# Patient Record
Sex: Female | Born: 2000 | Race: White | Hispanic: No | Marital: Single | Smoking: Never smoker
Health system: Southern US, Community
[De-identification: ages and names within clinical notes are randomized; demographics above are authoritative.]

## PROBLEM LIST (undated history)

## (undated) DIAGNOSIS — Z789 Other specified health status: Secondary | ICD-10-CM

## (undated) HISTORY — DX: Other specified health status: Z78.9

## (undated) HISTORY — PX: OTHER SURGICAL HISTORY: SHX169

## (undated) HISTORY — PX: NO PAST SURGERIES: SHX2092

---
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
## 2021-01-05 NOTE — Progress Notes (Signed)
Consulted by RN re: patient situation.  Reviewed RN note and agree that it reflects our discussion and my recommendations.  ° ° °Hollie Wojahn, FNP  °

## 2021-01-27 ENCOUNTER — Other Ambulatory Visit: Payer: Self-pay

## 2021-02-02 ENCOUNTER — Telehealth: Payer: Self-pay | Admitting: Certified Nurse Midwife

## 2021-02-02 NOTE — Telephone Encounter (Signed)
Patient was confirmed pregnant throught the [facility].  She stated that she started to bleed on Thursday which started off brown and now is redish pink in color.  Patient stated that she did have some cramping later last week in her lower back and abdominal area

## 2021-02-02 NOTE — Telephone Encounter (Signed)
Call patient, patient did not answer phone.

## 2021-02-03 ENCOUNTER — Encounter: Payer: Self-pay | Admitting: Certified Nurse Midwife

## 2021-02-03 ENCOUNTER — Other Ambulatory Visit (INDEPENDENT_AMBULATORY_CARE_PROVIDER_SITE_OTHER): Payer: Self-pay

## 2021-02-03 ENCOUNTER — Ambulatory Visit (INDEPENDENT_AMBULATORY_CARE_PROVIDER_SITE_OTHER): Payer: Self-pay | Admitting: Certified Nurse Midwife

## 2021-02-03 ENCOUNTER — Other Ambulatory Visit: Payer: Self-pay

## 2021-02-03 ENCOUNTER — Other Ambulatory Visit: Payer: Self-pay | Admitting: Certified Nurse Midwife

## 2021-02-03 VITALS — BP 127/82 | HR 116 | Wt 104.1 lb

## 2021-02-03 DIAGNOSIS — Z113 Encounter for screening for infections with a predominantly sexual mode of transmission: Secondary | ICD-10-CM

## 2021-02-03 DIAGNOSIS — Z3A13 13 weeks gestation of pregnancy: Secondary | ICD-10-CM

## 2021-02-03 DIAGNOSIS — O3680X0 Pregnancy with inconclusive fetal viability, not applicable or unspecified: Secondary | ICD-10-CM

## 2021-02-03 DIAGNOSIS — Z3402 Encounter for supervision of normal first pregnancy, second trimester: Secondary | ICD-10-CM

## 2021-02-03 DIAGNOSIS — Z0283 Encounter for blood-alcohol and blood-drug test: Secondary | ICD-10-CM

## 2021-02-03 NOTE — Progress Notes (Addendum)
NEW OB HISTORY AND PHYSICAL  SUBJECTIVE:       Pamela Glenn is a 20 y.o. G16P0000 female, Patient's last menstrual period was 10/30/2020 (exact date)., Estimated Date of Delivery: 08/06/21, [redacted]w[redacted]d, presents today for establishment of Prenatal Care. She has  had bleeding and cramping, with a small amount of clots noted.     Relationship : with her boyfriend  Living : with 2 roommates [employer]  Exercise none Alcohol/Drugs/Smoking:  denies use    Gynecologic History Patient's last menstrual period was 10/30/2020 (exact date). Normal Contraception: none Last Pap: n/a .   Obstetric History OB History  Gravida Para Term Preterm AB Living  1 0 0 0 0    SAB IAB Ectopic Multiple Live Births  0 0 0 0 0    # Outcome Date GA Lbr Len/2nd Weight Sex Delivery Anes PTL Lv  1 Current             Past Medical History:  Diagnosis Date   Patient denies medical problems     Past Surgical History:  Procedure Laterality Date   denies       Current Outpatient Medications on File Prior to Visit  Medication Sig Dispense Refill   Prenatal Vit-Fe Fumarate-FA (MULTIVITAMIN-PRENATAL) 27-0.8 MG TABS tablet Take 1 tablet by mouth daily at 12 noon. 100 tablet 0   No current facility-administered medications on file prior to visit.    No Known Allergies  Social History   Socioeconomic History   Marital status: Single    Spouse name: Not on file   Number of children: Not on file   Years of education: Not on file   Highest education level: Not on file  Occupational History   Not on file  Tobacco Use   Smoking status: Never   Smokeless tobacco: Never  Vaping Use   Vaping Use: Former   Substances: Nicotine  Substance and Sexual Activity   Alcohol use: Not Currently    Comment: last use 12/2020- bottle of vodka   Drug use: Not Currently    Types: Marijuana    Comment: stopped when found out preg (12/2020)   Sexual activity: Yes    Birth control/protection: None, Pill     Comment: hx of ocp, stopped 2021, prob with depression  Other Topics Concern   Not on file  Social History Narrative   Not on file   Social Determinants of Health   Financial Resource Strain: Not on file  Food Insecurity: Not on file  Transportation Needs: Not on file  Physical Activity: Not on file  Stress: Not on file  Social Connections: Not on file  Intimate Partner Violence: Not At Risk   Fear of Current or Ex-Partner: No   Emotionally Abused: No   Physically Abused: No   Sexually Abused: No    No family history on file.  The following portions of the patient's history were reviewed and updated as appropriate: allergies, current medications, past OB history, past medical history, past surgical history, past family history, past social history, and problem list.    OBJECTIVE: Initial Physical Exam (New OB)  GENERAL APPEARANCE: alert, well appearing, in no apparent distress, oriented to person, place and time HEAD: normocephalic, atraumatic MOUTH: mucous membranes moist, pharynx normal without lesions THYROID: no thyromegaly or masses present BREASTS: no masses noted, no significant tenderness, no palpable axillary nodes, no skin changes LUNGS: clear to auscultation, no wheezes, rales or rhonchi, symmetric air entry HEART: regular rate and rhythm,  no murmurs ABDOMEN: soft, nontender, nondistended, no abnormal masses, no epigastric pain and FHT not heard EXTREMITIES: no redness or tenderness in the calves or thighs, no edema, no limitation in range of motion, intact peripheral pulses SKIN: normal coloration and turgor, no rashes LYMPH NODES: no adenopathy palpable NEUROLOGIC: alert, oriented, normal speech, no focal findings or movement disorder noted  PELVIC EXAM EXTERNAL GENITALIA: normal appearing vulva with no masses, tenderness or lesions VAGINA: no abnormal discharge or lesions and blood Small amount pink red noted  CERVIX: no lesions or cervical motion  tenderness UTERUS: fundus not palpated ADNEXA: no masses palpable and nontender OB EXAM PELVIMETRY: appears adequate RECTUM: exam not indicated  Patient Name: Pamela Glenn DOB: 2000-07-21 MRN: [id number]  ULTRASOUND REPORT  Location: [facility] Date of Service: 02/03/2021   Indications:Threatened AB; bleeding Findings:  There is a Gestational Sac in the uterus measuring 36.2 mm (mean), consistent with 8wk6d gestation, however,  There is:  NO cardiac activity seen. NO Fetal Pole seen.  NO Yolk Sac seen. NO Amnion seen.  During the coarse of the scan, a blood clot was seen passing from the GS into the internal Os and into vaginal canal.  Ovaries are not visualized. Survey of the adnexa demonstrates no adnexal masses. There is no free peritoneal fluid in the cul de sac.  Ultrasound  Impression: 1. NON-Viable Intrauterine pregnancy by U/S.  Recommendations: 1.Clinical correlation with the patient's History and Physical Exam.  Sheralyn Boatman  H[place]-Gainey  ASSESSMENT: Miscarriage.   PLAN: Discussed miscarriage, beta quant ordered. Will follow up with results. Reassurance given. Discussed watchful waiting for passing of tissue , use of medications to help passing of tissue. Discussed referral for grief counseling if pt desires. She will let me know. Will follow up with results.    Doreene Burke, CNM

## 2021-02-03 NOTE — Patient Instructions (Addendum)
Miscarriage A miscarriage is the loss of a pregnancy before the 20th week of pregnancy.Sometimes, a pregnancy ends before a woman knows that she is pregnant. If you lose a pregnancy, talk with your doctor about: Questions you have about the loss of your baby. How to work through your grief. Plans for future pregnancy. What are the causes? Many times, the cause of this condition is not known. What increases the risk? These things may make a pregnant woman more likely to lose a pregnancy: Certain health conditions Conditions that affect hormones, such as: Thyroid disease. Polycystic ovary syndrome. Diabetes. A disease that causes the body's disease-fighting system to attack itself by mistake. Infections. Bleeding problems. Being very overweight. Lifestyle factors Using products that have tobacco or nicotine in them. Being around tobacco smoke. Having alcohol. Having a lot of caffeine. Using drugs. Problems with reproductive organs or parts Having a cervix that opens and thins before your due date. The cervix is the lowest part of your womb. Having Asherman syndrome, which leads to: Scars in the womb. The womb being abnormal in shape. Growths (fibroids) in the womb. Problems in the body that are present at birth. Infection of the cervix or womb. Personal or health history Injury. Having lost a pregnancy before. Being younger than age 18 or older than age 35. Being around a harmful substance, such as radiation. Having lead or other heavy metals in: Things you eat or drink. The air around you. Using certain medicines. What are the signs or symptoms? Blood or spots of blood coming from the vagina. You may also have cramps or pain. Pain or cramps in the belly or low back. Fluid or tissue coming out of the vagina. How is this treated? Sometimes, treatment is not needed. If you need treatment, you may be treated with: A procedure to open the cervix more and take tissue out of  the womb. Medicines. You may get a shot of medicine called Rho(D) immune globulin. Follow these instructions at home: Medicines Take over-the-counter and prescription medicines only as told by your doctor. If you were prescribed antibiotic medicine, take it as told by your doctor. Do not stop taking it even if you start to feel better. Activity Rest as told by your doctor. Ask your doctor what activities are safe for you. Have someone help you at home during this time. General instructions  Watch how much tissue comes out of the vagina. Watch the size of any blood clots that come out of the vagina. Do not have sex or douche until your doctor says it is okay. Do not put things, such as tampons, in your vagina until your doctor says it is okay. To help you and your partner with grieving: Talk with your doctor. See a counselor. When you are ready, talk with your doctor about: Things to do for your health. How you can be healthy if you get pregnant again. Keep all follow-up visits.  Where to find more information The American College of Obstetricians and Gynecologists: acog.org [place] Department of Health and Human Services Office of Women's Health: hrsa.gov/office-womens-health Contact a doctor if: You have a fever or chills. There is bad-smelling fluid coming from the vagina. You have more bleeding. Tissue or clots of blood come out of your vagina. Get help right away if: You have very bad cramps or pain in your back or belly. You soak more than 2 large pads in an hour for more than 2 hours. You get light-headed or weak. You faint.   You feel sad, and you have sad thoughts a lot of the time. You think about hurting yourself. Get help right awayif you feel like you may hurt yourself or others, or have thoughts about taking your own life. Go to your nearest emergency room or: Call your local emergency services (911 in the [place] Call the National Suicide Prevention Lifeline at  1-800-273-8255. This is open 24 hours a day. Text the Crisis Text Line at 741741. Summary A miscarriage is the loss of a pregnancy before the 20th week of pregnancy. Sometimes, a pregnancy ends before a woman knows that she is pregnant. Follow instructions from your doctor about medicines and activity. To help you and your partner with grieving, talk with your doctor or a counselor. Keep all follow-up visits. This information is not intended to replace advice given to you by your health care provider. Make sure you discuss any questions you have with your healthcare provider. Document Revised: 10/19/2019 Document Reviewed: 10/19/2019 Elsevier Patient Education  2022 Elsevier Inc.  

## 2021-02-04 ENCOUNTER — Encounter: Payer: Self-pay | Admitting: Certified Nurse Midwife

## 2021-02-04 LAB — BETA HCG QUANT (REF LAB): hCG Quant: 3727 m[IU]/mL

## 2021-02-09 ENCOUNTER — Other Ambulatory Visit: Payer: Self-pay | Admitting: Certified Nurse Midwife

## 2021-02-09 DIAGNOSIS — O039 Complete or unspecified spontaneous abortion without complication: Secondary | ICD-10-CM

## 2021-02-11 ENCOUNTER — Other Ambulatory Visit: Payer: Self-pay

## 2021-02-11 DIAGNOSIS — O039 Complete or unspecified spontaneous abortion without complication: Secondary | ICD-10-CM

## 2021-02-12 ENCOUNTER — Other Ambulatory Visit: Payer: Self-pay | Admitting: Certified Nurse Midwife

## 2021-02-12 DIAGNOSIS — O039 Complete or unspecified spontaneous abortion without complication: Secondary | ICD-10-CM

## 2021-02-12 LAB — BETA HCG QUANT (REF LAB): hCG Quant: 586 m[IU]/mL

## 2021-02-26 ENCOUNTER — Other Ambulatory Visit: Payer: Self-pay

## 2021-02-26 DIAGNOSIS — O039 Complete or unspecified spontaneous abortion without complication: Secondary | ICD-10-CM

## 2021-02-27 ENCOUNTER — Other Ambulatory Visit: Payer: Self-pay | Admitting: Certified Nurse Midwife

## 2021-02-27 DIAGNOSIS — O039 Complete or unspecified spontaneous abortion without complication: Secondary | ICD-10-CM

## 2021-02-27 LAB — BETA HCG QUANT (REF LAB): hCG Quant: 246 m[IU]/mL

## 2021-03-13 ENCOUNTER — Telehealth: Payer: Self-pay | Admitting: Certified Nurse Midwife

## 2021-03-13 ENCOUNTER — Other Ambulatory Visit: Payer: Self-pay

## 2021-03-13 DIAGNOSIS — O039 Complete or unspecified spontaneous abortion without complication: Secondary | ICD-10-CM

## 2021-03-13 NOTE — Telephone Encounter (Signed)
Pt called states that she had a miscarriage a month ago and has started spotting a little bit- she took a pregnancy test and it is bold positive lines. Pt is concerned and is wanting to know if she could be pregnant? Please Advise.

## 2021-03-16 ENCOUNTER — Other Ambulatory Visit: Payer: Self-pay

## 2021-03-16 DIAGNOSIS — O039 Complete or unspecified spontaneous abortion without complication: Secondary | ICD-10-CM

## 2021-03-17 DIAGNOSIS — Z3200 Encounter for pregnancy test, result unknown: Secondary | ICD-10-CM

## 2021-03-17 LAB — BETA HCG QUANT (REF LAB): hCG Quant: 66 m[IU]/mL

## 2021-03-19 ENCOUNTER — Telehealth: Payer: Self-pay | Admitting: Certified Nurse Midwife

## 2021-03-19 NOTE — Telephone Encounter (Signed)
Pt is calling in stating that she is bleeding heavy with changing her pad every hour or 1/2 hour a day.  She has had a miscarriage a few weeks ago and want to know what she should do.  Pt is cramping and not feeling so good and is very concerned.  Pt is aware that her provider is not in the office and a message will be sent back so that she can address it in the morning.

## 2021-03-20 NOTE — Telephone Encounter (Signed)
MSG sent to AT to advise.

## 2021-03-24 ENCOUNTER — Other Ambulatory Visit: Payer: Self-pay

## 2021-04-02 ENCOUNTER — Encounter: Payer: Self-pay | Admitting: Certified Nurse Midwife

## 2021-05-03 NOTE — L&D Delivery Note (Addendum)
Date of delivery: 02/08/2022 Estimated Date of Delivery: 03/11/22 Patient's last menstrual period was 05/18/2021 (exact date). EGA: [redacted]w[redacted]d  Delivery Note At 11:32 AM a viable female was delivered via spontaneous vaginal  Presentation:   OA/ROA   APGAR: 6, 9;       Weight:  5 pounds 8 ounces, 2510 g Placenta status: Spontaneous, Intact.   Cord: 3 vessels with the following complications: None.  Cord pH: NA  Called to see patient.  Mom pushed with excellent effort to deliver a viable female infant.  The head followed by shoulders, which delivered without difficulty, and the rest of the body.  Body cord noted/reduced after delivery and baby to mom's chest. Neonatologist requested cord cutting due to poor respiratory effort.  Newborn with some initial effort to cry and cord pulsing well at >100 bpm. Confirmed that Neo wanted cord clamped/cut. FOB asked if he wanted to cut cord and he declined. Grandmother accepted and cord clamped and cut after approximately 1 min delay. Baby to warmer in care of RN/Neonatologist for transition support. Cord blood obtained.  Placenta delivered spontaneously, intact, with a 3-vessel cord.  Right labial abrasion hemostatic without repair. All counts correct.  Hemostasis obtained with IV pitocin and fundal massage.    Anesthesia: Epidural Episiotomy: None Lacerations: Right labial abrasion Suture Repair:  NA Est. Blood Loss (mL): 200  Mom to  stay on L&D for 24 hours postpartum magnesium .  Baby to NICU.  Rod Can, CNM 02/08/2022, 12:08 PM

## 2021-07-14 ENCOUNTER — Encounter: Payer: Self-pay | Admitting: Certified Nurse Midwife

## 2021-07-14 ENCOUNTER — Other Ambulatory Visit: Payer: Self-pay

## 2021-07-14 ENCOUNTER — Ambulatory Visit (INDEPENDENT_AMBULATORY_CARE_PROVIDER_SITE_OTHER): Payer: 59 | Admitting: Certified Nurse Midwife

## 2021-07-14 VITALS — BP 125/83 | HR 103 | Ht 59.0 in | Wt 116.9 lb

## 2021-07-14 DIAGNOSIS — Z3200 Encounter for pregnancy test, result unknown: Secondary | ICD-10-CM | POA: Diagnosis not present

## 2021-07-14 DIAGNOSIS — Z8759 Personal history of other complications of pregnancy, childbirth and the puerperium: Secondary | ICD-10-CM

## 2021-07-14 LAB — POCT URINE PREGNANCY: Preg Test, Ur: POSITIVE — AB

## 2021-07-14 MED ORDER — DOXYLAMINE-PYRIDOXINE 10-10 MG PO TBEC: 1.0000 | DELAYED_RELEASE_TABLET | Freq: Four times a day (QID) | ORAL | 5 refills | Status: DC

## 2021-07-14 NOTE — Progress Notes (Signed)
Subjective:  ? ? Pamela Glenn is a 21 y.o. female who presents for evaluation of amenorrhea. She believes she could be pregnant. Pregnancy is desired. Sexual Activity: single partner, contraception: none. Current symptoms also include: nausea, positive home pregnancy test, and food aversion . Last period was normal. ?  ?Patient's last menstrual period was 05/18/2021 (exact date). ?The following portions of the patient's history were reviewed and updated as appropriate: allergies, current medications, past family history, past medical history, past social history, past surgical history, and problem list. ? ?Review of Systems ?Pertinent items are noted in HPI.   ?  ?Objective:  ? ? BP 125/83   Pulse (!) 103   Ht 4\' 11"  (1.499 m)   Wt 116 lb 14.4 oz (53 kg)   LMP 05/18/2021 (Exact Date)   Breastfeeding Unknown   BMI 23.61 kg/m?  ?General: alert, cooperative, appears stated age, mild distress, and no acute distress   ? ?Lab Review ?Urine HCG: positive  ?  ?Assessment:  ? ? Absence of menstruation.   ?  ?Plan:  ? ? Pregnancy Test:  Positive: EDC: 02/22/22. Briefly discussed pre-natal care options. Encouraged well-balanced diet, plenty of rest when needed, pre-natal vitamins daily and walking for exercise. Discussed self-help for nausea, avoiding OTC medications until consulting provider or pharmacist, other than Tylenol as needed, minimal caffeine (1-2 cups daily) and avoiding alcohol. She will schedule u/s for dating/viability 1 wk, nurse visit in 2 wks and her initial OB visit in 4 wks. Orders placed for diclegis . Feel free to call with any questions.  ? ?02/24/22, CNM  ?

## 2021-07-14 NOTE — Patient Instructions (Signed)
Prenatal Care ?Prenatal care is health care during pregnancy. It helps you and your unborn baby (fetus) stay as healthy as possible. Prenatal care may be provided by a midwife, a family practice doctor, a mid-level practitioner (nurse practitioner or physician assistant), or a childbirth and pregnancy doctor (obstetrician). ?How does this affect me? ?During pregnancy, you will be closely monitored for any new conditions that might develop. To lower your risk of pregnancy complications, you and your health care provider will talk about any underlying conditions you have. ?How does this affect my baby? ?Early and consistent prenatal care increases the chance that your baby will be healthy during pregnancy. Prenatal care lowers the risk that your baby will be: ?Born early (prematurely). ?Smaller than expected at birth (small for gestational age). ?What can I expect at the first prenatal care visit? ?Your first prenatal care visit will likely be the longest. You should schedule your first prenatal care visit as soon as you know that you are pregnant. Your first visit is a good time to talk about any questions or concerns you have about pregnancy. ?Medical history ?At your visit, you and your health care provider will talk about your medical history, including: ?Any past pregnancies. ?Your family's medical history. ?Medical history of the baby's father. ?Any long-term (chronic) health conditions you have and how you manage them. ?Any surgeries or procedures you have had. ?Any current over-the-counter or prescription medicines, herbs, or supplements that you are taking. ?Other factors that could pose a risk to your baby, including: ?Exposure to harmful chemicals or radiation at work or at home. ?Any substance use, including tobacco, alcohol, and drug use. ?Your home setting and your stress levels, including: ?Exposure to abuse or violence. ?Household financial strain. ?Your daily health habits, including diet and  exercise. ?Tests and screenings ?Your health care provider will: ?Measure your weight, height, and blood pressure. ?Do a physical exam, including a pelvic and breast exam. ?Perform blood tests and urine tests to check for: ?Urinary tract infection. ?Sexually transmitted infections (STIs). ?Low iron levels in your blood (anemia). ?Blood type and certain proteins on red blood cells (Rh antibodies). ?Infections and immunity to viruses, such as hepatitis B and rubella. ?HIV (human immunodeficiency virus). ?Discuss your options for genetic screening. ?Tips about staying healthy ?Your health care provider will also give you information about how to keep yourself and your baby healthy, including: ?Nutrition and taking vitamins. ?Physical activity. ?How to manage pregnancy symptoms such as nausea and vomiting (morning sickness). ?Infections and substances that may be harmful to your baby and how to avoid them. ?Food safety. ?Dental care. ?Working. ?Travel. ?Warning signs to watch for and when to call your health care provider. ?How often will I have prenatal care visits? ?After your first prenatal care visit, you will have regular visits throughout your pregnancy. The visit schedule is often as follows: ?Up to week 28 of pregnancy: once every 4 weeks. ?28-36 weeks: once every 2 weeks. ?After 36 weeks: every week until delivery. ?Some women may have visits more or less often depending on any underlying health conditions and the health of the baby. ?Keep all follow-up and prenatal care visits. This is important. ?What happens during routine prenatal care visits? ?Your health care provider will: ?Measure your weight and blood pressure. ?Check for fetal heart sounds. ?Measure the height of your uterus in your abdomen (fundal height). This may be measured starting around week 20 of pregnancy. ?Check the position of your baby inside your uterus. ?Ask questions   about your diet, sleeping patterns, and whether you can feel the baby  move. ?Review warning signs to watch for and signs of labor. ?Ask about any pregnancy symptoms you are having and how you are dealing with them. Symptoms may include: ?Headaches. ?Nausea and vomiting. ?Vaginal discharge. ?Swelling. ?Fatigue. ?Constipation. ?Changes in your vision. ?Feeling persistently sad or anxious. ?Any discomfort, including back or pelvic pain. ?Bleeding or spotting. ?Make a list of questions to ask your health care provider at your routine visits. ?What tests might I have during prenatal care visits? ?You may have blood, urine, and imaging tests throughout your pregnancy, such as: ?Urine tests to check for glucose, protein, or signs of infection. ?Glucose tests to check for a form of diabetes that can develop during pregnancy (gestational diabetes mellitus). This is usually done around week 24 of pregnancy. ?Ultrasounds to check your baby's growth and development, to check for birth defects, and to check your baby's well-being. These can also help to decide when you should deliver your baby. ?A test to check for group B strep (GBS) infection. This is usually done around week 36 of pregnancy. ?Genetic testing. This may include blood, fluid, or tissue sampling, or imaging tests, such as an ultrasound. Some genetic tests are done during the first trimester and some are done during the second trimester. ?What else can I expect during prenatal care visits? ?Your health care provider may recommend getting certain vaccines during pregnancy. These may include: ?A yearly flu shot (annual influenza vaccine). This is especially important if you will be pregnant during flu season. ?Tdap (tetanus, diphtheria, pertussis) vaccine. Getting this vaccine during pregnancy can protect your baby from whooping cough (pertussis) after birth. This vaccine may be recommended between weeks 27 and 36 of pregnancy. ?A COVID-19 vaccine. ?Later in your pregnancy, your health care provider may give you information  about: ?Childbirth and breastfeeding classes. ?Choosing a health care provider for your baby. ?Umbilical cord banking. ?Breastfeeding. ?Birth control after your baby is born. ?The hospital labor and delivery unit and how to set up a tour. ?Registering at the hospital before you go into labor. ?Where to find more information ?Office on Women's Health: womenshealth.gov ?American Pregnancy Association: americanpregnancy.org ?March of Dimes: marchofdimes.org ?Summary ?Prenatal care helps you and your baby stay as healthy as possible during pregnancy. ?Your first prenatal care visit will most likely be the longest. ?You will have visits and tests throughout your pregnancy to monitor your health and your baby's health. ?Bring a list of questions to your visits to ask your health care provider. ?Make sure to keep all follow-up and prenatal care visits. ?This information is not intended to replace advice given to you by your health care provider. Make sure you discuss any questions you have with your health care provider. ?Document Revised: 01/31/2020 Document Reviewed: 01/31/2020 ?Elsevier Patient Education ? 2022 Elsevier Inc. ? ?

## 2021-07-22 ENCOUNTER — Ambulatory Visit (INDEPENDENT_AMBULATORY_CARE_PROVIDER_SITE_OTHER): Payer: 59

## 2021-07-22 ENCOUNTER — Other Ambulatory Visit: Payer: Self-pay

## 2021-07-22 DIAGNOSIS — Z3200 Encounter for pregnancy test, result unknown: Secondary | ICD-10-CM

## 2021-07-22 DIAGNOSIS — Z3481 Encounter for supervision of other normal pregnancy, first trimester: Secondary | ICD-10-CM

## 2021-07-22 DIAGNOSIS — Z3A01 Less than 8 weeks gestation of pregnancy: Secondary | ICD-10-CM | POA: Diagnosis not present

## 2021-07-27 ENCOUNTER — Telehealth: Payer: Self-pay

## 2021-07-27 ENCOUNTER — Ambulatory Visit (INDEPENDENT_AMBULATORY_CARE_PROVIDER_SITE_OTHER): Payer: 59 | Admitting: Certified Nurse Midwife

## 2021-07-27 ENCOUNTER — Other Ambulatory Visit: Payer: Self-pay

## 2021-07-27 VITALS — BP 121/74 | HR 107 | Resp 16 | Ht 59.0 in | Wt 120.4 lb

## 2021-07-27 DIAGNOSIS — Z3401 Encounter for supervision of normal first pregnancy, first trimester: Secondary | ICD-10-CM

## 2021-07-27 DIAGNOSIS — Z3A10 10 weeks gestation of pregnancy: Secondary | ICD-10-CM | POA: Diagnosis not present

## 2021-07-27 DIAGNOSIS — Z113 Encounter for screening for infections with a predominantly sexual mode of transmission: Secondary | ICD-10-CM

## 2021-07-27 NOTE — Progress Notes (Signed)
Updated EDD based off U/S ?

## 2021-07-27 NOTE — Patient Instructions (Signed)
First Trimester of Pregnancy ?The first trimester of pregnancy starts on the first day of your last menstrual period until the end of week 12. This is also called months 1 through 3 of pregnancy. ?Body changes during your first trimester ?Your body goes through many changes during pregnancy. The changes usually return to normal after your baby is born. ?Physical changes ?You may gain or lose weight. ?Your breasts may grow larger and hurt. The area around your nipples may get darker. ?Dark spots or blotches may develop on your face. ?You may have changes in your hair. ?Health changes ?You may feel like you might vomit (nauseous), and you may vomit. ?You may have heartburn. ?You may have headaches. ?You may have trouble pooping (constipation). ?Your gums may bleed. ?Other changes ?You may get tired easily. ?You may pee (urinate) more often. ?Your menstrual periods will stop. ?You may not feel hungry. ?You may want to eat certain kinds of food. ?You may have changes in your emotions from day to day. ?You may have more dreams. ?Follow these instructions at home: ?Medicines ?Take over-the-counter and prescription medicines only as told by your doctor. Some medicines are not safe during pregnancy. ?Take a prenatal vitamin that contains at least 600 micrograms (mcg) of folic acid. ?Eating and drinking ?Eat healthy meals that include: ?Fresh fruits and vegetables. ?Whole grains. ?Good sources of protein, such as meat, eggs, or tofu. ?Low-fat dairy products. ?Avoid raw meat and unpasteurized juice, milk, and cheese. ?If you feel like you may vomit, or you vomit: ?Eat 4 or 5 small meals a day instead of 3 large meals. ?Try eating a few soda crackers. ?Drink liquids between meals instead of during meals. ?You may need to take these actions to prevent or treat trouble pooping: ?Drink enough fluids to keep your pee (urine) pale yellow. ?Eat foods that are high in fiber. These include beans, whole grains, and fresh fruits and  vegetables. ?Limit foods that are high in fat and sugar. These include fried or sweet foods. ?Activity ?Exercise only as told by your doctor. Most people can do their usual exercise routine during pregnancy. ?Stop exercising if you have cramps or pain in your lower belly (abdomen) or low back. ?Do not exercise if it is too hot or too humid, or if you are in a place of great height (high altitude). ?Avoid heavy lifting. ?If you choose to, you may have sex unless your doctor tells you not to. ?Relieving pain and discomfort ?Wear a good support bra if your breasts are sore. ?Rest with your legs raised (elevated) if you have leg cramps or low back pain. ?If you have bulging veins (varicose veins) in your legs: ?Wear support hose as told by your doctor. ?Raise your feet for 15 minutes, 3-4 times a day. ?Limit salt in your food. ?Safety ?Wear your seat belt at all times when you are in a car. ?Talk with your doctor if someone is hurting you or yelling at you. ?Talk with your doctor if you are feeling sad or have thoughts of hurting yourself. ?Lifestyle ?Do not use hot tubs, steam rooms, or saunas. ?Do not douche. Do not use tampons or scented sanitary pads. ?Do not use herbal medicines, illegal drugs, or medicines that are not approved by your doctor. Do not drink alcohol. ?Do not smoke or use any products that contain nicotine or tobacco. If you need help quitting, ask your doctor. ?Avoid cat litter boxes and soil that is used by cats. These carry germs   that can cause harm to the baby and can cause a loss of your baby by miscarriage or stillbirth. ?General instructions ?Keep all follow-up visits. This is important. ?Ask for help if you need counseling or if you need help with nutrition. Your doctor can give you advice or tell you where to go for help. ?Visit your dentist. At home, brush your teeth with a soft toothbrush. Floss gently. ?Write down your questions. Take them to your prenatal visits. ?Where to find more  information ?American Pregnancy Association: americanpregnancy.org ?American College of Obstetricians and Gynecologists: www.acog.org ?Office on Women's Health: womenshealth.gov/pregnancy ?Contact a doctor if: ?You are dizzy. ?You have a fever. ?You have mild cramps or pressure in your lower belly. ?You have a nagging pain in your belly area. ?You continue to feel like you may vomit, you vomit, or you have watery poop (diarrhea) for 24 hours or longer. ?You have a bad-smelling fluid coming from your vagina. ?You have pain when you pee. ?You are exposed to a disease that spreads from person to person, such as chickenpox, measles, Zika virus, HIV, or hepatitis. ?Get help right away if: ?You have spotting or bleeding from your vagina. ?You have very bad belly cramping or pain. ?You have shortness of breath or chest pain. ?You have any kind of injury, such as from a fall or a car crash. ?You have new or increased pain, swelling, or redness in an arm or leg. ?Summary ?The first trimester of pregnancy starts on the first day of your last menstrual period until the end of week 12 (months 1 through 3). ?Eat 4 or 5 small meals a day instead of 3 large meals. ?Do not smoke or use any products that contain nicotine or tobacco. If you need help quitting, ask your doctor. ?Keep all follow-up visits. ?This information is not intended to replace advice given to you by your health care provider. Make sure you discuss any questions you have with your health care provider. ?Document Revised: 09/26/2019 Document Reviewed: 08/02/2019 ?Elsevier Patient Education ? 2022 Elsevier Inc. ?Commonly Asked Questions During Pregnancy ? ?Cats: A parasite can be excreted in cat feces.  To avoid exposure you need to have another person empty the little box.  If you must empty the litter box you will need to wear gloves.  Wash your hands after handling your cat.  This parasite can also be found in raw or undercooked meat so this should also be  avoided. ? ?Colds, Sore Throats, Flu: Please check your medication sheet to see what you can take for symptoms.  If your symptoms are unrelieved by these medications please call the office. ? ?Dental Work: Most any dental work your dentist recommends is permitted.  X-rays should only be taken during the first trimester if absolutely necessary.  Your abdomen should be shielded with a lead apron during all x-rays.  Please notify your provider prior to receiving any x-rays.  Novocaine is fine; gas is not recommended.  If your dentist requires a note from us prior to dental work please call the office and we will provide one for you. ? ?Exercise: Exercise is an important part of staying healthy during your pregnancy.  You may continue most exercises you were accustomed to prior to pregnancy.  Later in your pregnancy you will most likely notice you have difficulty with activities requiring balance like riding a bicycle.  It is important that you listen to your body and avoid activities that put you at a higher risk   of falling.  Adequate rest and staying well hydrated are a must!  If you have questions about the safety of specific activities ask your provider.   ? ?Exposure to Children with illness: Try to avoid obvious exposure; report any symptoms to us when noted,  If you have chicken pos, red measles or mumps, you should be immune to these diseases.   Please do not take any vaccines while pregnant unless you have checked with your OB provider. ? ?Fetal Movement: After 28 weeks we recommend you do "kick counts" twice daily.  Lie or sit down in a calm quiet environment and count your baby movements "kicks".  You should feel your baby at least 10 times per hour.  If you have not felt 10 kicks within the first hour get up, walk around and have something sweet to eat or drink then repeat for an additional hour.  If count remains less than 10 per hour notify your provider. ? ?Fumigating: Follow your pest control agent's  advice as to how long to stay out of your home.  Ventilate the area well before re-entering. ? ?Hemorrhoids:   Most over-the-counter preparations can be used during pregnancy.  Check your medication to see what is s

## 2021-07-27 NOTE — Telephone Encounter (Signed)
Err. Entry. °

## 2021-07-27 NOTE — Progress Notes (Signed)
Pamela Glenn presents for NOB nurse interview visit. Pregnancy confirmation done 07/14/2021.  G2P0010. Pregnancy education material explained and given. No cats in the home. NOB labs ordered. HIV labs and Drug screen were explained optional and she did not decline. Drug screen ordered/declined. PNV encouraged. Genetic screening options discussed. Genetic testing: Discussed.  Pt may discuss with provider.  Financial policy reviewed. FMLA form reviewed and signed. Patient o follow up with Doreene Burke in 2 weeks for NOB physical.  All questions answered. ? ? ?

## 2021-07-28 LAB — URINALYSIS, ROUTINE W REFLEX MICROSCOPIC
Bilirubin, UA: NEGATIVE
Glucose, UA: NEGATIVE
Ketones, UA: NEGATIVE
Leukocytes,UA: NEGATIVE
Nitrite, UA: NEGATIVE
Protein,UA: NEGATIVE
RBC, UA: NEGATIVE
Specific Gravity, UA: >=1.03 — AB (ref 1.005–1.030)
Urobilinogen, Ur: 0.2 mg/dL (ref 0.2–1.0)
pH, UA: 5.5 (ref 5.0–7.5)

## 2021-07-28 LAB — PARVOVIRUS B19 ANTIBODY, IGG AND IGM
Parvovirus B19 IgG: 0.2 index (ref 0.0–0.8)
Parvovirus B19 IgM: 0.1 index (ref 0.0–0.8)

## 2021-07-28 LAB — GC/CHLAMYDIA PROBE AMP
Chlamydia trachomatis, NAA: NEGATIVE
Neisseria Gonorrhoeae by PCR: NEGATIVE

## 2021-07-28 LAB — VIRAL HEPATITIS HBV, HCV
HCV Ab: NONREACTIVE
Hep B Core Total Ab: NEGATIVE
Hep B Surface Ab, Qual: NONREACTIVE
Hepatitis B Surface Ag: NEGATIVE

## 2021-07-28 LAB — ANTIBODY SCREEN: Antibody Screen: NEGATIVE

## 2021-07-28 LAB — VARICELLA ZOSTER ANTIBODY, IGG: Varicella zoster IgG: 293 index (ref >165)

## 2021-07-28 LAB — RUBELLA SCREEN: Rubella Antibodies, IGG: 6.13 index (ref >0.99)

## 2021-07-28 LAB — RPR: RPR Ser Ql: NONREACTIVE

## 2021-07-28 LAB — HIV ANTIBODY (ROUTINE TESTING W REFLEX): HIV Screen 4th Generation wRfx: NONREACTIVE

## 2021-07-28 LAB — HCV INTERPRETATION

## 2021-07-28 LAB — ABO AND RH: Rh Factor: POSITIVE

## 2021-07-29 LAB — PAIN MGT SCRN (14 DRUGS), UR
Amphetamine Scrn, Ur: NEGATIVE ng/mL
BARBITURATE SCREEN URINE: NEGATIVE ng/mL
BENZODIAZEPINE SCREEN, URINE: NEGATIVE ng/mL
Buprenorphine, Urine: NEGATIVE ng/mL
CANNABINOIDS UR QL SCN: POSITIVE ng/mL — AB
Cocaine (Metab) Scrn, Ur: NEGATIVE ng/mL
Creatinine(Crt), U: 188.5 mg/dL (ref 20.0–300.0)
Fentanyl, Urine: NEGATIVE pg/mL
Meperidine Screen, Urine: NEGATIVE ng/mL
Methadone Screen, Urine: NEGATIVE ng/mL
OXYCODONE+OXYMORPHONE UR QL SCN: NEGATIVE ng/mL
Opiate Scrn, Ur: NEGATIVE ng/mL
Ph of Urine: 6.2 (ref 4.5–8.9)
Phencyclidine Qn, Ur: NEGATIVE ng/mL
Propoxyphene Scrn, Ur: NEGATIVE ng/mL
Tramadol Screen, Urine: NEGATIVE ng/mL

## 2021-07-29 LAB — NICOTINE SCREEN, URINE: Cotinine Ql Scrn, Ur: NEGATIVE ng/mL

## 2021-07-30 LAB — URINE CULTURE, OB REFLEX

## 2021-07-30 LAB — CULTURE, OB URINE

## 2021-07-31 ENCOUNTER — Encounter: Payer: Self-pay | Admitting: Certified Nurse Midwife

## 2021-07-31 ENCOUNTER — Other Ambulatory Visit: Payer: Self-pay | Admitting: Certified Nurse Midwife

## 2021-07-31 MED ORDER — NITROFURANTOIN MONOHYD MACRO 100 MG PO CAPS: 100.0000 mg | ORAL_CAPSULE | Freq: Two times a day (BID) | ORAL | 0 refills | Status: AC

## 2021-08-11 ENCOUNTER — Encounter: Payer: Self-pay | Admitting: Certified Nurse Midwife

## 2021-08-11 ENCOUNTER — Ambulatory Visit (INDEPENDENT_AMBULATORY_CARE_PROVIDER_SITE_OTHER): Payer: 59 | Admitting: Certified Nurse Midwife

## 2021-08-11 VITALS — BP 126/81 | HR 91 | Wt 119.8 lb

## 2021-08-11 DIAGNOSIS — Z3A09 9 weeks gestation of pregnancy: Secondary | ICD-10-CM | POA: Diagnosis not present

## 2021-08-11 DIAGNOSIS — Z3401 Encounter for supervision of normal first pregnancy, first trimester: Secondary | ICD-10-CM | POA: Diagnosis not present

## 2021-08-11 LAB — POCT URINALYSIS DIPSTICK OB
Bilirubin, UA: NEGATIVE
Blood, UA: NEGATIVE
Glucose, UA: NEGATIVE
Ketones, UA: NEGATIVE
Leukocytes, UA: NEGATIVE
Nitrite, UA: NEGATIVE
Spec Grav, UA: 1.015 (ref 1.010–1.025)
Urobilinogen, UA: 0.2 E.U./dL
pH, UA: 6 (ref 5.0–8.0)

## 2021-08-11 NOTE — Progress Notes (Signed)
NEW OB HISTORY AND PHYSICAL ? ?SUBJECTIVE:  ?    ? Pamela Glenn is a 21 y.o. G2P0010 female, Patient's last menstrual period was 05/18/2021 (exact date)., Estimated Date of Delivery: 03/11/22, [redacted]w[redacted]d, presents today for establishment of Prenatal Care. She  complains of breast tenderness.  ? ?Relationship with FOB ?Living with Friends ?Works Financial controller ?Exercise: none ?Denies alcohol, drugs, smoking and vaping. ?  ?Gynecologic History ?Patient's last menstrual period was 05/18/2021 (exact date). Normal ?Contraception: none ?Last Pap: n/a .  ? ?Obstetric History ?OB History  ?Gravida Para Term Preterm AB Living  ?2 0 0 0 1    ?SAB IAB Ectopic Multiple Live Births  ?1 0 0 0 0  ?  ?# Outcome Date GA Lbr Len/2nd Weight Sex Delivery Anes PTL Lv  ?2 Current           ?1 SAB 12/01/20          ? ? ?Past Medical History:  ?Diagnosis Date  ? Medical history non-contributory   ? Patient denies medical problems   ? ? ?Past Surgical History:  ?Procedure Laterality Date  ? denies     ? NO PAST SURGERIES    ? ? ?Current Outpatient Medications on File Prior to Visit  ?Medication Sig Dispense Refill  ? Prenatal MV & Min w/FA-DHA (ALIVE PRENATAL PO) Take by mouth.    ? ?No current facility-administered medications on file prior to visit.  ? ? ?No Known Allergies ? ?Social History  ? ?Socioeconomic History  ? Marital status: Single  ?  Spouse name: Not on file  ? Number of children: Not on file  ? Years of education: Not on file  ? Highest education level: Not on file  ?Occupational History  ? Not on file  ?Tobacco Use  ? Smoking status: Never  ?  Passive exposure: Never  ? Smokeless tobacco: Never  ?Vaping Use  ? Vaping Use: Former  ? Substances: Nicotine  ?Substance and Sexual Activity  ? Alcohol use: Not Currently  ?  Comment: last use 12/2020- bottle of vodka  ? Drug use: Not Currently  ?  Types: Marijuana  ? Sexual activity: Yes  ?  Birth control/protection: None  ?  Comment: hx of ocp, stopped 2021, prob with depression   ?Other Topics Concern  ? Not on file  ?Social History Narrative  ? Not on file  ? ?Social Determinants of Health  ? ?Financial Resource Strain: Not on file  ?Food Insecurity: Not on file  ?Transportation Needs: Not on file  ?Physical Activity: Not on file  ?Stress: Not on file  ?Social Connections: Not on file  ?Intimate Partner Violence: Not At Risk  ? Fear of Current or Ex-Partner: No  ? Emotionally Abused: No  ? Physically Abused: No  ? Sexually Abused: No  ? ? ?Family History  ?Problem Relation Age of Onset  ? COPD Maternal Grandmother   ? Breast cancer Maternal Grandmother   ? COPD Maternal Grandfather   ? Leukemia Paternal Grandfather   ? Lung cancer Paternal Great-grandfather   ? ? ?The following portions of the patient's history were reviewed and updated as appropriate: allergies, current medications, past OB history, past medical history, past surgical history, past family history, past social history, and problem list. ? ? ? ?OBJECTIVE: ?Initial Physical Exam (New OB) ? ?GENERAL APPEARANCE: alert, well appearing, in no apparent distress, oriented to person, place and time ?HEAD: normocephalic, atraumatic ?MOUTH: mucous membranes moist, pharynx normal without lesions ?THYROID: no  thyromegaly or masses present ?BREASTS: no masses noted, no significant tenderness, no palpable axillary nodes, no skin changes ?LUNGS: clear to auscultation, no wheezes, rales or rhonchi, symmetric air entry ?HEART: regular rate and rhythm, no murmurs ?ABDOMEN: soft, nontender, nondistended, no abnormal masses, no epigastric pain and fetal heart tones not asuclated due to early gestation.  ?EXTREMITIES: no redness or tenderness in the calves or thighs, no edema, no limitation in range of motion, intact peripheral pulses ?SKIN: normal coloration and turgor, no rashes ?LYMPH NODES: no adenopathy palpable ?NEUROLOGIC: alert, oriented, normal speech, no focal findings or movement disorder noted ? ?PELVIC EXAM EXTERNAL GENITALIA:  normal appearing vulva with no masses, tenderness or lesions ?VAGINA: no abnormal discharge or lesions ?CERVIX: no lesions or cervical motion tenderness ?UTERUS: gravid ?ADNEXA: no masses palpable and nontender ?OB EXAM PELVIMETRY: appears adequate ?RECTUM: exam not indicated ? ?ASSESSMENT: ?Normal pregnancy ? ?PLAN: ?Prenatal care ?See orders New OB counseling: ?The patient has been given an overview regarding routine prenatal care. Recommendations regarding diet, weight gain, and exercise in pregnancy were given.Prenatal testing, optional genetic testing, carrier screening , and ultrasound use in pregnancy were reviewed.  Plans maternit 21 @ 12 wks. Benefits of Breast Feeding were discussed. The patient is encouraged to consider nursing her baby post partum.  ? ?Philip Aspen, CNM  ?

## 2021-08-16 LAB — URINE CULTURE

## 2021-08-17 ENCOUNTER — Encounter: Payer: Self-pay | Admitting: Certified Nurse Midwife

## 2021-08-17 ENCOUNTER — Other Ambulatory Visit: Payer: Self-pay | Admitting: Certified Nurse Midwife

## 2021-08-17 MED ORDER — AMPICILLIN 500 MG PO CAPS: 500.0000 mg | ORAL_CAPSULE | Freq: Four times a day (QID) | ORAL | 0 refills | Status: AC

## 2021-09-01 ENCOUNTER — Ambulatory Visit (INDEPENDENT_AMBULATORY_CARE_PROVIDER_SITE_OTHER): Payer: 59 | Admitting: Certified Nurse Midwife

## 2021-09-01 VITALS — BP 126/80 | HR 114 | Wt 120.9 lb

## 2021-09-01 DIAGNOSIS — Z3A12 12 weeks gestation of pregnancy: Secondary | ICD-10-CM

## 2021-09-01 DIAGNOSIS — Z1379 Encounter for other screening for genetic and chromosomal anomalies: Secondary | ICD-10-CM

## 2021-09-01 DIAGNOSIS — Z3481 Encounter for supervision of other normal pregnancy, first trimester: Secondary | ICD-10-CM

## 2021-09-01 LAB — POCT URINALYSIS DIPSTICK OB
Bilirubin, UA: NEGATIVE
Blood, UA: NEGATIVE
Glucose, UA: NEGATIVE
Ketones, UA: NEGATIVE
Leukocytes, UA: NEGATIVE
Nitrite, UA: NEGATIVE
POC,PROTEIN,UA: NEGATIVE
Spec Grav, UA: 1.015 (ref 1.010–1.025)
Urobilinogen, UA: 0.2 E.U./dL
pH, UA: 7 (ref 5.0–8.0)

## 2021-09-01 NOTE — Progress Notes (Signed)
Rob doing well, state nausea is improving.  Discussed when she might start to feel movement. Reviewed anatomy u/s @ 20 wks.  Maternit 21 collected today. . Follow up 4 wks with Missy for ROB. ?

## 2021-09-01 NOTE — Patient Instructions (Signed)
Prenatal Care Prenatal care is health care during pregnancy. It helps you and your unborn baby (fetus) stay as healthy as possible. Prenatal care may be provided by a midwife, a family practice doctor, a mid-level practitioner (nurse practitioner or physician assistant), or a childbirth and pregnancy doctor (obstetrician). How does this affect me? During pregnancy, you will be closely monitored for any new conditions that might develop. To lower your risk of pregnancy complications, you and your health care provider will talk about any underlying conditions you have. How does this affect my baby? Early and consistent prenatal care increases the chance that your baby will be healthy during pregnancy. Prenatal care lowers the risk that your baby will be: Born early (prematurely). Smaller than expected at birth (small for gestational age). What can I expect at the first prenatal care visit? Your first prenatal care visit will likely be the longest. You should schedule your first prenatal care visit as soon as you know that you are pregnant. Your first visit is a good time to talk about any questions or concerns you have about pregnancy. Medical history At your visit, you and your health care provider will talk about your medical history, including: Any past pregnancies. Your family's medical history. Medical history of the baby's father. Any long-term (chronic) health conditions you have and how you manage them. Any surgeries or procedures you have had. Any current over-the-counter or prescription medicines, herbs, or supplements that you are taking. Other factors that could pose a risk to your baby, including: Exposure to harmful chemicals or radiation at work or at home. Any substance use, including tobacco, alcohol, and drug use. Your home setting and your stress levels, including: Exposure to abuse or violence. Household financial strain. Your daily health habits, including diet and  exercise. Tests and screenings Your health care provider will: Measure your weight, height, and blood pressure. Do a physical exam, including a pelvic and breast exam. Perform blood tests and urine tests to check for: Urinary tract infection. Sexually transmitted infections (STIs). Low iron levels in your blood (anemia). Blood type and certain proteins on red blood cells (Rh antibodies). Infections and immunity to viruses, such as hepatitis B and rubella. HIV (human immunodeficiency virus). Discuss your options for genetic screening. Tips about staying healthy Your health care provider will also give you information about how to keep yourself and your baby healthy, including: Nutrition and taking vitamins. Physical activity. How to manage pregnancy symptoms such as nausea and vomiting (morning sickness). Infections and substances that may be harmful to your baby and how to avoid them. Food safety. Dental care. Working. Travel. Warning signs to watch for and when to call your health care provider. How often will I have prenatal care visits? After your first prenatal care visit, you will have regular visits throughout your pregnancy. The visit schedule is often as follows: Up to week 28 of pregnancy: once every 4 weeks. 28-36 weeks: once every 2 weeks. After 36 weeks: every week until delivery. Some women may have visits more or less often depending on any underlying health conditions and the health of the baby. Keep all follow-up and prenatal care visits. This is important. What happens during routine prenatal care visits? Your health care provider will: Measure your weight and blood pressure. Check for fetal heart sounds. Measure the height of your uterus in your abdomen (fundal height). This may be measured starting around week 20 of pregnancy. Check the position of your baby inside your uterus. Ask questions   about your diet, sleeping patterns, and whether you can feel the baby  move. Review warning signs to watch for and signs of labor. Ask about any pregnancy symptoms you are having and how you are dealing with them. Symptoms may include: Headaches. Nausea and vomiting. Vaginal discharge. Swelling. Fatigue. Constipation. Changes in your vision. Feeling persistently sad or anxious. Any discomfort, including back or pelvic pain. Bleeding or spotting. Make a list of questions to ask your health care provider at your routine visits. What tests might I have during prenatal care visits? You may have blood, urine, and imaging tests throughout your pregnancy, such as: Urine tests to check for glucose, protein, or signs of infection. Glucose tests to check for a form of diabetes that can develop during pregnancy (gestational diabetes mellitus). This is usually done around week 24 of pregnancy. Ultrasounds to check your baby's growth and development, to check for birth defects, and to check your baby's well-being. These can also help to decide when you should deliver your baby. A test to check for group B strep (GBS) infection. This is usually done around week 36 of pregnancy. Genetic testing. This may include blood, fluid, or tissue sampling, or imaging tests, such as an ultrasound. Some genetic tests are done during the first trimester and some are done during the second trimester. What else can I expect during prenatal care visits? Your health care provider may recommend getting certain vaccines during pregnancy. These may include: A yearly flu shot (annual influenza vaccine). This is especially important if you will be pregnant during flu season. Tdap (tetanus, diphtheria, pertussis) vaccine. Getting this vaccine during pregnancy can protect your baby from whooping cough (pertussis) after birth. This vaccine may be recommended between weeks 27 and 36 of pregnancy. A COVID-19 vaccine. Later in your pregnancy, your health care provider may give you information  about: Childbirth and breastfeeding classes. Choosing a health care provider for your baby. Umbilical cord banking. Breastfeeding. Birth control after your baby is born. The hospital labor and delivery unit and how to set up a tour. Registering at the hospital before you go into labor. Where to find more information Office on Women's Health: womenshealth.gov American Pregnancy Association: americanpregnancy.org March of Dimes: marchofdimes.org Summary Prenatal care helps you and your baby stay as healthy as possible during pregnancy. Your first prenatal care visit will most likely be the longest. You will have visits and tests throughout your pregnancy to monitor your health and your baby's health. Bring a list of questions to your visits to ask your health care provider. Make sure to keep all follow-up and prenatal care visits. This information is not intended to replace advice given to you by your health care provider. Make sure you discuss any questions you have with your health care provider. Document Revised: 01/31/2020 Document Reviewed: 01/31/2020 Elsevier Patient Education  2023 Elsevier Inc.  

## 2021-09-02 LAB — CBC
Hematocrit: 39.2 % (ref 34.0–46.6)
Hemoglobin: 13.8 g/dL (ref 11.1–15.9)
MCH: 30.5 pg (ref 26.6–33.0)
MCHC: 35.2 g/dL (ref 31.5–35.7)
MCV: 87 fL (ref 79–97)
Platelets: 194 10*3/uL (ref 150–450)
RBC: 4.52 x10E6/uL (ref 3.77–5.28)
RDW: 13.1 % (ref 11.7–15.4)
WBC: 6.8 10*3/uL (ref 3.4–10.8)

## 2021-09-08 LAB — MATERNIT21  PLUS CORE+ESS+SCA, BLOOD
11q23 deletion (Jacobsen): NOT DETECTED
15q11 deletion (PW Angelman): NOT DETECTED
1p36 deletion syndrome: NOT DETECTED
22q11 deletion (DiGeorge): NOT DETECTED
4p16 deletion(Wolf-Hirschhorn): NOT DETECTED
5p15 deletion (Cri-du-chat): NOT DETECTED
8q24 deletion (Langer-Giedion): NOT DETECTED
Fetal Fraction: 13
Monosomy X (Turner Syndrome): NOT DETECTED
Result (T21): NEGATIVE
Trisomy 13 (Patau syndrome): NEGATIVE
Trisomy 16: NOT DETECTED
Trisomy 18 (Edwards syndrome): NEGATIVE
Trisomy 21 (Down syndrome): NEGATIVE
Trisomy 22: NOT DETECTED
XXX (Triple X Syndrome): NOT DETECTED
XXY (Klinefelter Syndrome): NOT DETECTED
XYY (Jacobs Syndrome): NOT DETECTED

## 2021-10-01 ENCOUNTER — Encounter: Payer: 59 | Admitting: Obstetrics

## 2021-10-02 ENCOUNTER — Encounter: Payer: 59 | Admitting: Obstetrics

## 2021-10-22 ENCOUNTER — Encounter: Payer: Self-pay | Admitting: Obstetrics

## 2021-10-22 ENCOUNTER — Ambulatory Visit (INDEPENDENT_AMBULATORY_CARE_PROVIDER_SITE_OTHER): Payer: 59 | Admitting: Obstetrics

## 2021-10-22 VITALS — BP 121/75 | HR 102 | Wt 129.9 lb

## 2021-10-22 DIAGNOSIS — Z3A20 20 weeks gestation of pregnancy: Secondary | ICD-10-CM

## 2021-10-22 LAB — POCT URINALYSIS DIPSTICK OB
Bilirubin, UA: NEGATIVE
Blood, UA: NEGATIVE
Glucose, UA: NEGATIVE
Ketones, UA: NEGATIVE
Leukocytes, UA: NEGATIVE
Nitrite, UA: NEGATIVE
POC,PROTEIN,UA: NEGATIVE
Spec Grav, UA: 1.02 (ref 1.010–1.025)
Urobilinogen, UA: 0.2 E.U./dL
pH, UA: 6 (ref 5.0–8.0)

## 2021-10-26 ENCOUNTER — Telehealth: Payer: Self-pay | Admitting: Certified Nurse Midwife

## 2021-10-29 ENCOUNTER — Ambulatory Visit
Admission: RE | Admit: 2021-10-29 | Discharge: 2021-10-29 | Disposition: A | Discharge status: Home / Self Care | Payer: 59 | Source: Ambulatory Visit | Attending: Obstetrics | Admitting: Obstetrics

## 2021-10-29 DIAGNOSIS — Z3689 Encounter for other specified antenatal screening: Secondary | ICD-10-CM | POA: Insufficient documentation

## 2021-10-29 DIAGNOSIS — Z3A20 20 weeks gestation of pregnancy: Secondary | ICD-10-CM | POA: Diagnosis present

## 2021-10-29 DIAGNOSIS — O321XX0 Maternal care for breech presentation, not applicable or unspecified: Secondary | ICD-10-CM | POA: Diagnosis not present

## 2021-11-18 ENCOUNTER — Ambulatory Visit (INDEPENDENT_AMBULATORY_CARE_PROVIDER_SITE_OTHER): Payer: 59 | Admitting: Certified Nurse Midwife

## 2021-11-18 ENCOUNTER — Other Ambulatory Visit: Payer: 59

## 2021-11-18 VITALS — BP 133/88 | HR 109 | Wt 137.1 lb

## 2021-11-18 DIAGNOSIS — Z3A23 23 weeks gestation of pregnancy: Secondary | ICD-10-CM

## 2021-11-18 NOTE — Progress Notes (Signed)
ROB doing well, feeling fetal movement. Discussed glucose screen next visit. Information sheet given. She is interested in alternative to Glucola drink. Sheet given with instructions to bring the item with her and that she will need to consume it in the office . She verbalizes and agrees to plan.   Follow up 4 wks for ROB.  Doreene Burke, CNM

## 2021-11-18 NOTE — Patient Instructions (Signed)
Round Ligament Pain  The round ligaments are a pair of cord-like tissues that help support the uterus. They can become a source of pain during pregnancy as the ligaments soften and stretch as the baby grows. The pain usually begins in the second trimester (13-28 weeks) of pregnancy, and should only last for a few seconds when it occurs. However, the pain can come and go until the baby is delivered. The pain does not cause harm to the baby. Round ligament pain is usually a short, sharp, and pinching pain, but it can also be a dull, lingering, and aching pain. The pain is felt in the lower side of the abdomen or in the groin. It usually starts deep in the groin and moves up to the outside of the hip area. The pain may happen when you: Suddenly change position, such as quickly going from a sitting to standing position. Do physical activity. Cough or sneeze. Follow these instructions at home: Managing pain  When the pain starts, relax. Then, try any of these methods to help with the pain: Sit down. Flex your knees up to your abdomen. Lie on your side with one pillow under your abdomen and another pillow between your legs. Sit in a warm bath for 15-20 minutes or until the pain goes away. General instructions Watch your condition for any changes. Move slowly when you sit down or stand up. Stop or reduce your physical activities if they cause pain. Avoid long walks if they cause pain. Take over-the-counter and prescription medicines only as told by your health care provider. Keep all follow-up visits. This is important. Contact a health care provider if: Your pain does not go away with treatment. You feel pain in your back that you did not have before. Your medicine is not helping. You have a fever or chills. You have nausea or vomiting. You have diarrhea. You have pain when you urinate. Get help right away if: You have pain that is a rhythmic, cramping pain similar to labor pains. Labor  pains are usually 2 minutes apart, last for about 1 minute, and involve a bearing down feeling or pressure in your pelvis. You have vaginal bleeding. These symptoms may represent a serious problem that is an emergency. Do not wait to see if the symptoms will go away. Get medical help right away. Call your local emergency services (911 in the [place] Do not drive yourself to the hospital. Summary Round ligament pain is felt in the lower abdomen or groin. This pain usually begins in the second trimester (13-28 weeks) and should only last for a few seconds when it occurs. You may notice the pain when you suddenly change position, when you cough or sneeze, or during physical activity. Relaxing, flexing your knees to your abdomen, lying on one side, or taking a warm bath may help to get rid of the pain. Contact your health care provider if the pain does not go away. This information is not intended to replace advice given to you by your health care provider. Make sure you discuss any questions you have with your health care provider. Document Revised: 07/02/2020 Document Reviewed: 07/02/2020 Elsevier Patient Education  2023 Elsevier Inc.  

## 2021-12-14 ENCOUNTER — Ambulatory Visit (INDEPENDENT_AMBULATORY_CARE_PROVIDER_SITE_OTHER): Payer: 59 | Admitting: Obstetrics

## 2021-12-14 ENCOUNTER — Other Ambulatory Visit: Payer: 59

## 2021-12-14 ENCOUNTER — Encounter: Payer: Self-pay | Admitting: Obstetrics

## 2021-12-14 VITALS — BP 117/77 | HR 98 | Wt 143.1 lb

## 2021-12-14 DIAGNOSIS — Z3A27 27 weeks gestation of pregnancy: Secondary | ICD-10-CM

## 2021-12-14 DIAGNOSIS — Z3403 Encounter for supervision of normal first pregnancy, third trimester: Secondary | ICD-10-CM

## 2021-12-14 LAB — POCT URINALYSIS DIPSTICK OB
Bilirubin, UA: NEGATIVE
Blood, UA: NEGATIVE
Glucose, UA: NEGATIVE
Ketones, UA: NEGATIVE
Leukocytes, UA: NEGATIVE
Nitrite, UA: NEGATIVE
POC,PROTEIN,UA: NEGATIVE
Spec Grav, UA: 1.015 (ref 1.010–1.025)
Urobilinogen, UA: 0.2 E.U./dL
pH, UA: 6 (ref 5.0–8.0)

## 2021-12-14 NOTE — Progress Notes (Addendum)
ROB at [redacted]w[redacted]d. Active baby. Denies ctx, LOF, vaginal bleeding. Sway would like to have an unmedicated birth and is considering a Systems developer. Encouraged to sign up for CBE and waterbirth class. Birth plan handout given. Discussed GBS status and recommendations for tx during labor. Will likely use OCPs for contraception; contraceptive handout given. Plans to breastfeed. RSB BTC done today. Would like to do more research before deciding about TDaP. Discussed fetal kick counts and expectations for third trimester. Questions answered. 1-hour glucose, CBC, RPR today. RTC in 2 weeks.  Pamela Glenn, CNM

## 2021-12-15 ENCOUNTER — Encounter: Payer: Self-pay | Admitting: Obstetrics

## 2021-12-15 LAB — CBC
Hematocrit: 32.9 % — ABNORMAL LOW (ref 34.0–46.6)
Hemoglobin: 11.2 g/dL (ref 11.1–15.9)
MCH: 28.6 pg (ref 26.6–33.0)
MCHC: 34 g/dL (ref 31.5–35.7)
MCV: 84 fL (ref 79–97)
Platelets: 232 10*3/uL (ref 150–450)
RBC: 3.92 x10E6/uL (ref 3.77–5.28)
RDW: 12.3 % (ref 11.7–15.4)
WBC: 8.9 10*3/uL (ref 3.4–10.8)

## 2021-12-15 LAB — RPR: RPR Ser Ql: NONREACTIVE

## 2021-12-15 LAB — GLUCOSE, 1 HOUR GESTATIONAL: Gestational Diabetes Screen: 98 mg/dL (ref 70–139)

## 2021-12-16 ENCOUNTER — Encounter: Payer: Self-pay | Admitting: Obstetrics

## 2022-01-05 ENCOUNTER — Encounter: Payer: Self-pay | Admitting: Obstetrics

## 2022-01-05 ENCOUNTER — Ambulatory Visit (INDEPENDENT_AMBULATORY_CARE_PROVIDER_SITE_OTHER): Payer: 59 | Admitting: Obstetrics

## 2022-01-05 VITALS — BP 122/82 | HR 108 | Wt 146.0 lb

## 2022-01-05 DIAGNOSIS — Z3403 Encounter for supervision of normal first pregnancy, third trimester: Secondary | ICD-10-CM

## 2022-01-05 DIAGNOSIS — Z3A30 30 weeks gestation of pregnancy: Secondary | ICD-10-CM

## 2022-01-05 LAB — POCT URINALYSIS DIPSTICK OB
Appearance: NEGATIVE
Bilirubin, UA: NEGATIVE
Blood, UA: NEGATIVE
Glucose, UA: NEGATIVE
Ketones, UA: NEGATIVE
Leukocytes, UA: NEGATIVE
Nitrite, UA: NEGATIVE
Odor: NEGATIVE
POC,PROTEIN,UA: NEGATIVE
Spec Grav, UA: 1.02 (ref 1.010–1.025)
Urobilinogen, UA: 0.2 E.U./dL
pH, UA: 6.5 (ref 5.0–8.0)

## 2022-01-05 NOTE — Progress Notes (Signed)
ROB at [redacted]w[redacted]d. Baby is active. Alexandrina has been having some BH ctx that resolve with rest. Denies LOF and vaginal bleeding. She is working 4 10-hour shifts a week but plans to cut back to 8-hour shift. Reviewed comfort measures for swelling in feet. Anticipatory guidance about 3rd trimester. Reviewed s/s of PTL and when to go to the hospital. Still plans to sign up for water birth class. RTC in 2 weeks.  Guadlupe Spanish, CNM

## 2022-01-05 NOTE — Addendum Note (Signed)
Addended by: Loney Laurence on: 01/05/2022 10:19 AM   Modules accepted: Orders

## 2022-01-19 ENCOUNTER — Ambulatory Visit (INDEPENDENT_AMBULATORY_CARE_PROVIDER_SITE_OTHER): Payer: 59 | Admitting: Certified Nurse Midwife

## 2022-01-19 ENCOUNTER — Encounter: Payer: Self-pay | Admitting: Certified Nurse Midwife

## 2022-01-19 VITALS — BP 131/87 | HR 118 | Wt 156.5 lb

## 2022-01-19 DIAGNOSIS — Z3A32 32 weeks gestation of pregnancy: Secondary | ICD-10-CM

## 2022-01-19 DIAGNOSIS — H539 Unspecified visual disturbance: Secondary | ICD-10-CM

## 2022-01-19 NOTE — Progress Notes (Signed)
ROB doing well, feeling good movement. Has c/o swelling of feet and ankles. Bilateral swelling noted +1. Discussed self help measures. She denies epigastric pain and an has had some mild headaches. She admits to visual changes. Labs collected today. Will follow up with results. ROB in 2 wks or PRN.   Philip Aspen, CNM

## 2022-01-19 NOTE — Addendum Note (Signed)
Addended by: Ova Freshwater on: 01/19/2022 03:43 PM   Modules accepted: Orders

## 2022-01-20 ENCOUNTER — Encounter: Payer: Self-pay | Admitting: Certified Nurse Midwife

## 2022-01-20 LAB — PROTEIN / CREATININE RATIO, URINE
Creatinine, Urine: 99.1 mg/dL
Protein, Ur: 17.6 mg/dL
Protein/Creat Ratio: 178 mg/g creat (ref 0–200)

## 2022-01-20 LAB — COMPREHENSIVE METABOLIC PANEL
ALT: 8 IU/L (ref 0–32)
AST: 15 IU/L (ref 0–40)
Albumin/Globulin Ratio: 1.4 (ref 1.2–2.2)
Albumin: 3.2 g/dL — ABNORMAL LOW (ref 4.0–5.0)
Alkaline Phosphatase: 173 IU/L — ABNORMAL HIGH (ref 42–106)
BUN/Creatinine Ratio: 13 (ref 9–23)
BUN: 7 mg/dL (ref 6–20)
Bilirubin Total: <0.2 mg/dL (ref 0.0–1.2)
CO2: 19 mmol/L — ABNORMAL LOW (ref 20–29)
Calcium: 9 mg/dL (ref 8.7–10.2)
Chloride: 104 mmol/L (ref 96–106)
Creatinine, Ser: 0.55 mg/dL — ABNORMAL LOW (ref 0.57–1.00)
Globulin, Total: 2.3 g/dL (ref 1.5–4.5)
Glucose: 98 mg/dL (ref 70–99)
Potassium: 4.1 mmol/L (ref 3.5–5.2)
Sodium: 139 mmol/L (ref 134–144)
Total Protein: 5.5 g/dL — ABNORMAL LOW (ref 6.0–8.5)
eGFR: 134 mL/min/{1.73_m2} (ref >59)

## 2022-01-20 LAB — CBC
Hematocrit: 32.6 % — ABNORMAL LOW (ref 34.0–46.6)
Hemoglobin: 10.3 g/dL — ABNORMAL LOW (ref 11.1–15.9)
MCH: 25.2 pg — ABNORMAL LOW (ref 26.6–33.0)
MCHC: 31.6 g/dL (ref 31.5–35.7)
MCV: 80 fL (ref 79–97)
Platelets: 215 10*3/uL (ref 150–450)
RBC: 4.09 x10E6/uL (ref 3.77–5.28)
RDW: 13.3 % (ref 11.7–15.4)
WBC: 7.1 10*3/uL (ref 3.4–10.8)

## 2022-01-21 LAB — GC/CHLAMYDIA PROBE AMP
Chlamydia trachomatis, NAA: NEGATIVE
Neisseria Gonorrhoeae by PCR: NEGATIVE

## 2022-02-03 ENCOUNTER — Observation Stay
Admission: EM | Admit: 2022-02-03 | Discharge: 2022-02-03 | Disposition: A | Discharge status: Home / Self Care | Payer: 59 | Attending: Obstetrics | Admitting: Obstetrics

## 2022-02-03 ENCOUNTER — Other Ambulatory Visit: Payer: Self-pay

## 2022-02-03 ENCOUNTER — Ambulatory Visit (INDEPENDENT_AMBULATORY_CARE_PROVIDER_SITE_OTHER): Payer: 59 | Admitting: Licensed Practical Nurse

## 2022-02-03 ENCOUNTER — Encounter: Payer: Self-pay | Admitting: Obstetrics

## 2022-02-03 VITALS — BP 142/96 | HR 99 | Wt 171.0 lb

## 2022-02-03 DIAGNOSIS — Z3400 Encounter for supervision of normal first pregnancy, unspecified trimester: Secondary | ICD-10-CM

## 2022-02-03 DIAGNOSIS — O163 Unspecified maternal hypertension, third trimester: Secondary | ICD-10-CM | POA: Diagnosis present

## 2022-02-03 DIAGNOSIS — O133 Gestational [pregnancy-induced] hypertension without significant proteinuria, third trimester: Secondary | ICD-10-CM

## 2022-02-03 DIAGNOSIS — Z3A34 34 weeks gestation of pregnancy: Secondary | ICD-10-CM | POA: Diagnosis not present

## 2022-02-03 LAB — COMPREHENSIVE METABOLIC PANEL
ALT: 11 U/L (ref 0–44)
AST: 21 U/L (ref 15–41)
Albumin: 2.6 g/dL — ABNORMAL LOW (ref 3.5–5.0)
Alkaline Phosphatase: 148 U/L — ABNORMAL HIGH (ref 38–126)
Anion gap: 5 (ref 5–15)
BUN: 13 mg/dL (ref 6–20)
CO2: 23 mmol/L (ref 22–32)
Calcium: 8.6 mg/dL — ABNORMAL LOW (ref 8.9–10.3)
Chloride: 110 mmol/L (ref 98–111)
Creatinine, Ser: 0.76 mg/dL (ref 0.44–1.00)
GFR, Estimated: >60 mL/min (ref >60)
Glucose, Bld: 68 mg/dL — ABNORMAL LOW (ref 70–99)
Potassium: 4.5 mmol/L (ref 3.5–5.1)
Sodium: 138 mmol/L (ref 135–145)
Total Bilirubin: 0.4 mg/dL (ref 0.3–1.2)
Total Protein: 6.3 g/dL — ABNORMAL LOW (ref 6.5–8.1)

## 2022-02-03 LAB — PROTEIN / CREATININE RATIO, URINE
Creatinine, Urine: 261 mg/dL
Total Protein, Urine: >3000 mg/dL

## 2022-02-03 LAB — CBC
HCT: 32.5 % — ABNORMAL LOW (ref 36.0–46.0)
Hemoglobin: 10 g/dL — ABNORMAL LOW (ref 12.0–15.0)
MCH: 23.9 pg — ABNORMAL LOW (ref 26.0–34.0)
MCHC: 30.8 g/dL (ref 30.0–36.0)
MCV: 77.6 fL — ABNORMAL LOW (ref 80.0–100.0)
Platelets: 253 10*3/uL (ref 150–400)
RBC: 4.19 MIL/uL (ref 3.87–5.11)
RDW: 14.2 % (ref 11.5–15.5)
WBC: 8.6 10*3/uL (ref 4.0–10.5)
nRBC: 0 % (ref 0.0–0.2)

## 2022-02-03 LAB — POCT URINALYSIS DIPSTICK
Blood, UA: NEGATIVE
Glucose, UA: NEGATIVE
Leukocytes, UA: NEGATIVE
Protein, UA: POSITIVE — AB
Spec Grav, UA: >=1.03 — AB (ref 1.010–1.025)
pH, UA: 5 (ref 5.0–8.0)

## 2022-02-03 MED ORDER — LABETALOL HCL 5 MG/ML IV SOLN: 80.0000 mg | INTRAVENOUS | Status: DC | PRN

## 2022-02-03 MED ORDER — LABETALOL HCL 5 MG/ML IV SOLN: 20.0000 mg | INTRAVENOUS | Status: DC | PRN

## 2022-02-03 MED ORDER — HYDRALAZINE HCL 20 MG/ML IJ SOLN: 10.0000 mg | INTRAMUSCULAR | Status: DC | PRN

## 2022-02-03 MED ORDER — LABETALOL HCL 100 MG PO TABS: 100.0000 mg | ORAL_TABLET | Freq: Two times a day (BID) | ORAL | 3 refills | Status: DC

## 2022-02-03 MED ORDER — FUSION PLUS PO CAPS: 1.0000 | ORAL_CAPSULE | Freq: Every day | ORAL | 3 refills | Status: DC

## 2022-02-03 MED ORDER — BLOOD PRESSURE MONITOR/L CUFF MISC: 1.0000 [IU] | Freq: Every day | 0 refills | Status: DC

## 2022-02-03 MED ORDER — LABETALOL HCL 5 MG/ML IV SOLN: 40.0000 mg | INTRAVENOUS | Status: DC | PRN

## 2022-02-03 MED ORDER — ACETAMINOPHEN 325 MG PO TABS: 650.0000 mg | ORAL_TABLET | ORAL | Status: DC | PRN

## 2022-02-03 NOTE — Progress Notes (Signed)
Routine Prenatal Care Visit  Subjective  Pamela Glenn is a 21 y.o. G2P0010 at [redacted]w[redacted]d being seen today for ongoing prenatal care.  She is currently monitored for the following issues for this low-risk pregnancy and has History of miscarriage on their problem list.  ----------------------------------------------------------------------------------- Patient reports  seeing spots for "sometime" .  Here with boyfriend and mother. Has significant BLE, denies HA but has seen spots for a while now. BP elevated  x 2 (148/110, 142/96) in the office, reviewed previous preeclampsia work up. Recommend going to L and D triage for preeclampsia work up, anticipatory guidance given.   Contractions: Not present. Vag. Bleeding: None.  Movement: Present. Leaking Fluid denies.  ----------------------------------------------------------------------------------- The following portions of the patient's history were reviewed and updated as appropriate: allergies, current medications, past family history, past medical history, past social history, past surgical history and problem list. Problem list updated.  Objective  Blood pressure (!) 142/96, pulse 99, weight 171 lb (77.6 kg), last menstrual period 05/18/2021, unknown if currently breastfeeding. Pregravid weight 117 lb (53.1 kg) Total Weight Gain 54 lb (24.5 kg) Urinalysis: Urine Protein    Urine Glucose    Fetal Status: Fetal Heart Rate (bpm): 135 Fundal Height: 35 cm Movement: Present     General:  Alert, oriented and cooperative. Patient is in no acute distress.  Skin: Skin is warm and dry. No rash noted.   Cardiovascular: Normal heart rate noted  Respiratory: Normal respiratory effort, no problems with respiration noted  Abdomen: Soft, gravid, appropriate for gestational age. Pain/Pressure: Absent     Pelvic:  Cervical exam deferred        Extremities: Normal range of motion.     Mental Status: Normal mood and affect. Normal behavior. Normal judgment and  thought content.   Assessment   21 y.o. G2P0010 at [redacted]w[redacted]d by  03/11/2022, by Ultrasound presenting for routine prenatal visit  Plan   g2 Problems (from 07/14/21 to present)     No problems associated with this episode.        Preterm labor symptoms and general obstetric precautions including but not limited to vaginal bleeding, contractions, leaking of fluid and fetal movement were reviewed in detail with the patient. Please refer to After Visit Summary for other counseling recommendations.   Pt enroute to L and D, L and D and Missy S CNM aware.  Roberto Scales, [place], [facility]  02/03/22  4:02 PM

## 2022-02-03 NOTE — OB Triage Note (Signed)
LABOR & DELIVERY OB TRIAGE NOTE  SUBJECTIVE  HPI Pamela Glenn is a 21 y.o. G2P0010 at [redacted]w[redacted]d who presents to Labor & Delivery for evaluation of elevated BPs. Her initial BPs were in the 130s-150/80s-90s. BPs since 1900 have remained been 130s/80s. She continues to deny HA, visual changes, and epigastric pain.  OB History     Gravida  2   Para  0   Term  0   Preterm  0   AB  1   Living         SAB  1   IAB  0   Ectopic  0   Multiple  0   Live Births  0           Scheduled Meds: Continuous Infusions: PRN Meds:.acetaminophen, labetalol **AND** labetalol **AND** labetalol **AND** hydrALAZINE **AND** Measure blood pressure  OBJECTIVE  BP 135/87   Pulse 95   Temp 98.9 F (37.2 C) (Oral)   Resp 17   Ht 4\' 11"  (1.499 m)   Wt 77.6 kg   LMP 05/18/2021 (Exact Date)   BMI 34.54 kg/m   NST I reviewed the NST and it was reactive.  Baseline: 130 Variability: moderate Accelerations: present Decelerations:none Toco: irritability Category 1  ASSESSMENT Impression  1) Pregnancy at G2P0010, [redacted]w[redacted]d, Estimated Date of Delivery: 03/11/22 2) Reassuring maternal/fetal status 3) Elevated blood pressures, does not meet criteria for GHTN at this time, but high suspicion for development of GHTN  PLAN  1) Discharge home with strict return/labor precautions. Will f/u on PC ratio as outpatient. 2) Close outpatient follow-up. Schedule office visit for BP check on Friday. 3) Start labetalol 100 PO BID, reassess need after BP check on Friday. Discussed plan with Dr. Amalia Hailey, who is in agreement.  Lloyd Huger, CNM

## 2022-02-03 NOTE — OB Triage Note (Signed)
LABOR & DELIVERY OB TRIAGE NOTE  SUBJECTIVE  HPI Pamela Glenn is a 21 y.o. G2P0010 at [redacted]w[redacted]d who presents to Labor & Delivery for evaluation of elevated blood pressures. At her prenatal visit today, she had two elevated BPs as well as BLE edema. She denies HA and epigastric pain. She does report that for the past few weeks, she has seen spots when she wakes up but this resolves spontaneously. She has tried compression socks, elevating her legs, and staying hydrated but has no relief from the swelling.    OB History     Gravida  2   Para  0   Term  0   Preterm  0   AB  1   Living         SAB  1   IAB  0   Ectopic  0   Multiple  0   Live Births  0           Scheduled Meds: Continuous Infusions: PRN Meds:.acetaminophen, labetalol **AND** labetalol **AND** labetalol **AND** hydrALAZINE **AND** Measure blood pressure  OBJECTIVE  BP (!) 136/97   Pulse (!) 102   Temp 98.9 F (37.2 C) (Oral)   Resp 17   Ht 4\' 11"  (1.499 m)   Wt 77.6 kg   LMP 05/18/2021 (Exact Date)   BMI 34.54 kg/m   General: alert, cooperative, NAD Cardiac: RRR Lungs: CTAB Abdomen: soft, gravid, non-tender Lower extremities: +1 pitting edema to knees, non-pitting edema to thighs Reflexes: +3 bilaterally, no clonus  NST I reviewed the NST and it was reactive.  Baseline: 130 Variability: moderate Accelerations: present Decelerations:none Toco: irregular, mild Category 1  ASSESSMENT Impression  1) Pregnancy at G2P0010, [redacted]w[redacted]d, Estimated Date of Delivery: 03/11/22 2) Reassuring maternal/fetal status 3) Elevated BPs - GHTN vs preeclampsia  PLAN  1) Preeclampsia labs 2) Monitor blood pressure over next few hours 3) Treat severe range pressures PRN  Lloyd Huger, CNM

## 2022-02-04 LAB — RPR: RPR Ser Ql: NONREACTIVE

## 2022-02-05 ENCOUNTER — Ambulatory Visit (INDEPENDENT_AMBULATORY_CARE_PROVIDER_SITE_OTHER): Payer: 59 | Admitting: Obstetrics

## 2022-02-05 VITALS — BP 132/89 | HR 101 | Ht 59.0 in | Wt 175.6 lb

## 2022-02-05 DIAGNOSIS — Z0130 Encounter for examination of blood pressure without abnormal findings: Secondary | ICD-10-CM

## 2022-02-05 DIAGNOSIS — O163 Unspecified maternal hypertension, third trimester: Secondary | ICD-10-CM

## 2022-02-05 NOTE — Progress Notes (Signed)
Patient presents for blood pressure check today after recent L&D visit. She states she has not taken her medication since leaving the hospital due to not hearing from the pharmacy, advised the prescription was received and confirmed by the pharmacy. Patient declines headaches, blurry vision or any other symptoms. She reports feeling pressure and contractions, advised her to monitor contractions and pay attention to timing. Instructed patient to call on-call provider over the weekend or go to ED for any issues.

## 2022-02-06 ENCOUNTER — Other Ambulatory Visit: Payer: Self-pay

## 2022-02-06 ENCOUNTER — Encounter: Payer: Self-pay | Admitting: Obstetrics and Gynecology

## 2022-02-06 ENCOUNTER — Inpatient Hospital Stay
Admission: EM | Admit: 2022-02-06 | Discharge: 2022-02-10 | DRG: 807 | Disposition: A | Discharge status: Home / Self Care | Payer: 59 | Attending: Obstetrics and Gynecology | Admitting: Obstetrics and Gynecology

## 2022-02-06 DIAGNOSIS — O99824 Streptococcus B carrier state complicating childbirth: Secondary | ICD-10-CM | POA: Diagnosis present

## 2022-02-06 DIAGNOSIS — O1490 Unspecified pre-eclampsia, unspecified trimester: Principal | ICD-10-CM | POA: Diagnosis present

## 2022-02-06 DIAGNOSIS — O1414 Severe pre-eclampsia complicating childbirth: Principal | ICD-10-CM | POA: Diagnosis present

## 2022-02-06 DIAGNOSIS — Z3A35 35 weeks gestation of pregnancy: Secondary | ICD-10-CM

## 2022-02-06 NOTE — OB Triage Note (Signed)
Pt presents c/o elevated BP. Pt denies HA, blurry vision, or epigastric pain. Pt denies pain, bleeding or LOF. Pt reports positive fetal movement. PT has +3 pitting edema. +2 reflexes and no clonus. BPs at home was 157/105, 179/116,186/122. InitialBP here was 151/100. Bps cycling q 22mins. All other VSS. Will continue to monitor.

## 2022-02-07 DIAGNOSIS — O1490 Unspecified pre-eclampsia, unspecified trimester: Principal | ICD-10-CM | POA: Diagnosis present

## 2022-02-07 DIAGNOSIS — O1414 Severe pre-eclampsia complicating childbirth: Secondary | ICD-10-CM | POA: Diagnosis present

## 2022-02-07 DIAGNOSIS — R030 Elevated blood-pressure reading, without diagnosis of hypertension: Secondary | ICD-10-CM | POA: Diagnosis present

## 2022-02-07 DIAGNOSIS — Z3A35 35 weeks gestation of pregnancy: Secondary | ICD-10-CM | POA: Diagnosis not present

## 2022-02-07 DIAGNOSIS — O99824 Streptococcus B carrier state complicating childbirth: Secondary | ICD-10-CM | POA: Diagnosis present

## 2022-02-07 DIAGNOSIS — O99820 Streptococcus B carrier state complicating pregnancy: Secondary | ICD-10-CM | POA: Diagnosis not present

## 2022-02-07 LAB — URINE DRUG SCREEN, QUALITATIVE (ARMC ONLY)
Amphetamines, Ur Screen: NOT DETECTED
Barbiturates, Ur Screen: NOT DETECTED
Benzodiazepine, Ur Scrn: NOT DETECTED
Cannabinoid 50 Ng, Ur ~~LOC~~: NOT DETECTED
Cocaine Metabolite,Ur ~~LOC~~: NOT DETECTED
MDMA (Ecstasy)Ur Screen: NOT DETECTED
Methadone Scn, Ur: NOT DETECTED
Opiate, Ur Screen: NOT DETECTED
Phencyclidine (PCP) Ur S: NOT DETECTED
Tricyclic, Ur Screen: NOT DETECTED

## 2022-02-07 LAB — COMPREHENSIVE METABOLIC PANEL
ALT: 10 U/L (ref 0–44)
AST: 17 U/L (ref 15–41)
Albumin: 2.2 g/dL — ABNORMAL LOW (ref 3.5–5.0)
Alkaline Phosphatase: 151 U/L — ABNORMAL HIGH (ref 38–126)
Anion gap: 6 (ref 5–15)
BUN: 16 mg/dL (ref 6–20)
CO2: 23 mmol/L (ref 22–32)
Calcium: 8.5 mg/dL — ABNORMAL LOW (ref 8.9–10.3)
Chloride: 108 mmol/L (ref 98–111)
Creatinine, Ser: 0.81 mg/dL (ref 0.44–1.00)
GFR, Estimated: >60 mL/min (ref >60)
Glucose, Bld: 97 mg/dL (ref 70–99)
Potassium: 4.6 mmol/L (ref 3.5–5.1)
Sodium: 137 mmol/L (ref 135–145)
Total Bilirubin: 0.3 mg/dL (ref 0.3–1.2)
Total Protein: 5.5 g/dL — ABNORMAL LOW (ref 6.5–8.1)

## 2022-02-07 LAB — CBC
HCT: 30.4 % — ABNORMAL LOW (ref 36.0–46.0)
Hemoglobin: 9.6 g/dL — ABNORMAL LOW (ref 12.0–15.0)
MCH: 24.6 pg — ABNORMAL LOW (ref 26.0–34.0)
MCHC: 31.6 g/dL (ref 30.0–36.0)
MCV: 77.7 fL — ABNORMAL LOW (ref 80.0–100.0)
Platelets: 224 10*3/uL (ref 150–400)
RBC: 3.91 MIL/uL (ref 3.87–5.11)
RDW: 14.5 % (ref 11.5–15.5)
WBC: 8.6 10*3/uL (ref 4.0–10.5)
nRBC: 0 % (ref 0.0–0.2)

## 2022-02-07 LAB — HIV ANTIBODY (ROUTINE TESTING W REFLEX): HIV Screen 4th Generation wRfx: NONREACTIVE

## 2022-02-07 LAB — RPR: RPR Ser Ql: NONREACTIVE

## 2022-02-07 LAB — PROTEIN / CREATININE RATIO, URINE
Creatinine, Urine: 169 mg/dL
Protein Creatinine Ratio: 12.99 mg/mg{Cre} — ABNORMAL HIGH (ref 0.00–0.15)
Total Protein, Urine: 2196 mg/dL

## 2022-02-07 LAB — ABO/RH: ABO/RH(D): O POS

## 2022-02-07 LAB — RAPID HIV SCREEN (HIV 1/2 AB+AG)
HIV 1/2 Antibodies: NONREACTIVE
HIV-1 P24 Antigen - HIV24: NONREACTIVE

## 2022-02-07 LAB — TYPE AND SCREEN
ABO/RH(D): O POS
Antibody Screen: NEGATIVE

## 2022-02-07 MED ORDER — BETAMETHASONE SOD PHOS & ACET 6 (3-3) MG/ML IJ SUSP
12.0000 mg | Freq: Once | INTRAMUSCULAR | Status: AC
  Administered 2022-02-08: 12 mg via INTRAMUSCULAR

## 2022-02-07 MED ORDER — HYDRALAZINE HCL 20 MG/ML IJ SOLN: 10.0000 mg | INTRAMUSCULAR | Status: DC | PRN

## 2022-02-07 MED ORDER — SOD CITRATE-CITRIC ACID 500-334 MG/5ML PO SOLN: 30.0000 mL | ORAL | Status: DC | PRN

## 2022-02-07 MED ORDER — ONDANSETRON HCL 4 MG/2ML IJ SOLN
4.0000 mg | Freq: Four times a day (QID) | INTRAMUSCULAR | Status: DC | PRN
  Filled 2022-02-07: qty 2

## 2022-02-07 MED ORDER — LIDOCAINE HCL (PF) 1 % IJ SOLN: 30.0000 mL | INTRAMUSCULAR | Status: DC | PRN

## 2022-02-07 MED ORDER — PENICILLIN G POT IN DEXTROSE 60000 UNIT/ML IV SOLN
3.0000 10*6.[IU] | INTRAVENOUS | Status: DC
  Administered 2022-02-08 (×3): 3 10*6.[IU] via INTRAVENOUS
  Filled 2022-02-07 (×3): qty 50

## 2022-02-07 MED ORDER — LABETALOL HCL 5 MG/ML IV SOLN: 80.0000 mg | INTRAVENOUS | Status: DC | PRN

## 2022-02-07 MED ORDER — FENTANYL CITRATE (PF) 100 MCG/2ML IJ SOLN: 50.0000 ug | INTRAMUSCULAR | Status: DC | PRN

## 2022-02-07 MED ORDER — SODIUM CHLORIDE 0.9 % IV SOLN
5.0000 10*6.[IU] | Freq: Once | INTRAVENOUS | Status: AC
  Administered 2022-02-07: 5 10*6.[IU] via INTRAVENOUS
  Filled 2022-02-07: qty 5

## 2022-02-07 MED ORDER — LABETALOL HCL 5 MG/ML IV SOLN: 20.0000 mg | INTRAVENOUS | Status: DC | PRN

## 2022-02-07 MED ORDER — OXYTOCIN BOLUS FROM INFUSION
333.0000 mL | Freq: Once | INTRAVENOUS | Status: AC
  Administered 2022-02-08: 333 mL via INTRAVENOUS

## 2022-02-07 MED ORDER — MISOPROSTOL 25 MCG QUARTER TABLET: 25.0000 ug | ORAL_TABLET | ORAL | Status: DC

## 2022-02-07 MED ORDER — CALCIUM CARBONATE ANTACID 500 MG PO CHEW
CHEWABLE_TABLET | ORAL | Status: AC
  Filled 2022-02-07: qty 2

## 2022-02-07 MED ORDER — LABETALOL HCL 5 MG/ML IV SOLN: 40.0000 mg | INTRAVENOUS | Status: DC | PRN

## 2022-02-07 MED ORDER — LACTATED RINGERS IV SOLN: INTRAVENOUS | Status: DC

## 2022-02-07 MED ORDER — CALCIUM CARBONATE ANTACID 500 MG PO CHEW
2.0000 | CHEWABLE_TABLET | Freq: Once | ORAL | Status: AC
  Administered 2022-02-07: 400 mg via ORAL

## 2022-02-07 MED ORDER — MISOPROSTOL 25 MCG QUARTER TABLET
ORAL_TABLET | ORAL | Status: AC
  Administered 2022-02-07: 25 ug via ORAL
  Filled 2022-02-07: qty 2

## 2022-02-07 MED ORDER — TERBUTALINE SULFATE 1 MG/ML IJ SOLN
0.2500 mg | Freq: Once | INTRAMUSCULAR | Status: AC | PRN
  Administered 2022-02-07: 0.25 mg via SUBCUTANEOUS
  Filled 2022-02-07: qty 1

## 2022-02-07 MED ORDER — LACTATED RINGERS IV SOLN
500.0000 mL | INTRAVENOUS | Status: DC | PRN
  Administered 2022-02-07: 500 mL via INTRAVENOUS

## 2022-02-07 MED ORDER — OXYTOCIN-SODIUM CHLORIDE 30-0.9 UT/500ML-% IV SOLN
1.0000 m[IU]/min | INTRAVENOUS | Status: DC
  Administered 2022-02-08 (×3): 2 m[IU]/min via INTRAVENOUS
  Filled 2022-02-07: qty 500

## 2022-02-07 MED ORDER — SODIUM CHLORIDE 0.9 % IV SOLN: 5.0000 10*6.[IU] | Freq: Once | INTRAVENOUS | Status: DC

## 2022-02-07 MED ORDER — MISOPROSTOL 25 MCG QUARTER TABLET
25.0000 ug | ORAL_TABLET | Freq: Once | ORAL | Status: AC
  Administered 2022-02-07: 25 ug via ORAL
  Filled 2022-02-07: qty 1

## 2022-02-07 MED ORDER — OXYTOCIN-SODIUM CHLORIDE 30-0.9 UT/500ML-% IV SOLN
2.5000 [IU]/h | INTRAVENOUS | Status: DC
  Administered 2022-02-08: 2.5 [IU]/h via INTRAVENOUS

## 2022-02-07 MED ORDER — MISOPROSTOL 25 MCG QUARTER TABLET
25.0000 ug | ORAL_TABLET | ORAL | Status: DC
  Administered 2022-02-07: 25 ug via VAGINAL

## 2022-02-07 MED ORDER — MISOPROSTOL 25 MCG QUARTER TABLET
25.0000 ug | ORAL_TABLET | Freq: Once | ORAL | Status: AC
  Administered 2022-02-07: 25 ug via VAGINAL
  Filled 2022-02-07: qty 1

## 2022-02-07 MED ORDER — BETAMETHASONE SOD PHOS & ACET 6 (3-3) MG/ML IJ SUSP
12.0000 mg | Freq: Once | INTRAMUSCULAR | Status: AC
  Administered 2022-02-07: 12 mg via INTRAMUSCULAR
  Filled 2022-02-07: qty 5

## 2022-02-07 MED ORDER — PENICILLIN G POTASSIUM 5000000 UNITS IJ SOLR
2.5000 10*6.[IU] | INTRAVENOUS | Status: DC
  Filled 2022-02-07 (×4): qty 2.5

## 2022-02-07 NOTE — Progress Notes (Signed)
Subjective:  Comfortable.  Denies any Headaches, visual disturbances or RUQ.  Reviewed plan of care, Pt agreeable with moving forward with IOL.   Objective:   Vitals: Blood pressure 139/78, pulse (!) 121, temperature 98.3 F (36.8 C), temperature source Oral, resp. rate 18, height 4\' 11"  (1.499 m), weight 77.6 kg, last menstrual period 05/18/2021, SpO2 100 %, unknown if currently breastfeeding. General: NAD Abdomen:non tender  Cervical Exam:  Dilation: 1 Effacement (%): 50 Cervical Position: Posterior Station: -2 Presentation: Vertex Exam by:: Alma Friendly Mystic Labo CNM  FHT: baseline 125, moderate variability, pos accel, neg decel  Toco:irregular   Results for orders placed or performed during the hospital encounter of 02/06/22 (from the past 24 hour(s))  Protein / creatinine ratio, urine     Status: Abnormal   Collection Time: 02/06/22 11:25 PM  Result Value Ref Range   Creatinine, Urine 169 mg/dL   Total Protein, Urine 2,196 mg/dL   Protein Creatinine Ratio 12.99 (H) 0.00 - 0.15 mg/mg[Cre]  Urine Drug Screen, Qualitative ([facility] only)     Status: None   Collection Time: 02/06/22 11:25 PM  Result Value Ref Range   Tricyclic, Ur Screen NONE DETECTED NONE DETECTED   Amphetamines, Ur Screen NONE DETECTED NONE DETECTED   MDMA (Ecstasy)Ur Screen NONE DETECTED NONE DETECTED   Cocaine Metabolite,Ur Quinter NONE DETECTED NONE DETECTED   Opiate, Ur Screen NONE DETECTED NONE DETECTED   Phencyclidine (PCP) Ur S NONE DETECTED NONE DETECTED   Cannabinoid 50 Ng, Ur  NONE DETECTED NONE DETECTED   Barbiturates, Ur Screen NONE DETECTED NONE DETECTED   Benzodiazepine, Ur Scrn NONE DETECTED NONE DETECTED   Methadone Scn, Ur NONE DETECTED NONE DETECTED  CBC     Status: Abnormal   Collection Time: 02/07/22 12:28 AM  Result Value Ref Range   WBC 8.6 4.0 - 10.5 K/uL   RBC 3.91 3.87 - 5.11 MIL/uL   Hemoglobin 9.6 (L) 12.0 - 15.0 g/dL   HCT 30.4 (L) 36.0 - 46.0 %   MCV 77.7 (L) 80.0 - 100.0 fL   MCH  24.6 (L) 26.0 - 34.0 pg   MCHC 31.6 30.0 - 36.0 g/dL   RDW 14.5 11.5 - 15.5 %   Platelets 224 150 - 400 K/uL   nRBC 0.0 0.0 - 0.2 %  Comprehensive metabolic panel     Status: Abnormal   Collection Time: 02/07/22 12:28 AM  Result Value Ref Range   Sodium 137 135 - 145 mmol/L   Potassium 4.6 3.5 - 5.1 mmol/L   Chloride 108 98 - 111 mmol/L   CO2 23 22 - 32 mmol/L   Glucose, Bld 97 70 - 99 mg/dL   BUN 16 6 - 20 mg/dL   Creatinine, Ser 0.81 0.44 - 1.00 mg/dL   Calcium 8.5 (L) 8.9 - 10.3 mg/dL   Total Protein 5.5 (L) 6.5 - 8.1 g/dL   Albumin 2.2 (L) 3.5 - 5.0 g/dL   AST 17 15 - 41 U/L   ALT 10 0 - 44 U/L   Alkaline Phosphatase 151 (H) 38 - 126 U/L   Total Bilirubin 0.3 0.3 - 1.2 mg/dL   GFR, Estimated >60 >60 mL/min   Anion gap 6 5 - 15  ABO/Rh     Status: None   Collection Time: 02/07/22 12:28 AM  Result Value Ref Range   ABO/RH(D)      O POS Performed at [facility], [address] [place], [place] [address]   Type and screen [facility]     Status: None   Collection Time: 02/07/22  2:26 AM  Result Value Ref Range   ABO/RH(D) O POS    Antibody Screen NEG    Sample Expiration      02/10/2022,2359 Performed at [facility], [address] [place], [place] [address]   RPR     Status: None   Collection Time: 02/07/22  2:26 AM  Result Value Ref Range   RPR Ser Ql NON REACTIVE NON REACTIVE  Rapid HIV screen (HIV 1/2 Ab+Ag) ([facility] Only)     Status: None   Collection Time: 02/07/22  2:26 AM  Result Value Ref Range   HIV-1 P24 Antigen - HIV24 NON REACTIVE NON REACTIVE   HIV 1/2 Antibodies NON REACTIVE NON REACTIVE   Interpretation (HIV Ag Ab)      A non reactive test result means that HIV 1 or HIV 2 antibodies and HIV 1 p24 antigen were not detected in the specimen.  HIV Antibody (routine testing w rflx)     Status: None   Collection Time: 02/07/22  2:33 AM  Result Value Ref Range   HIV Screen 4th Generation wRfx Non Reactive Non  Reactive    Assessment:   21 y.o. G2P0010 [redacted]w[redacted]d admitted for preeclampsia with severe Features  Plan:   1) Labor -Reviewed labs and BP's with Dr Leafy Ro, given severe range BP readings at home and and elevated UPC pt meets criteria for induction for Preeclampsia with severe features.    FB easily inserted around 1500, Cytotec 21mcg vaginal and 36mcg Oral given.   Magnesium to be started   2) Fetus - Category 1 tracing   3) GBS positive-PCN ordered, Membranes intact  4) Pain management: pt aware of all options, will ask when desired.   Roberto Scales, CNM  [place], [facility]  @TODAY @  3:03 PM

## 2022-02-07 NOTE — Progress Notes (Signed)
Subjective:  Walked in to room, pt in hands and knees d/t prolonged decel.  Fetal heart rate returned to baseline. Pt now comfortable and repositioned to right tilt.   Objective:   Vitals: Blood pressure 124/77, pulse (!) 109, temperature 98.6 F (37 C), temperature source Oral, resp. rate 16, height 4\' 11"  (1.499 m), weight 77.6 kg, last menstrual period 05/18/2021, SpO2 98 %, unknown if currently breastfeeding. General: NAD Abdomen:non tender  Cervical Exam:  Dilation: 3.5 Effacement (%): 70 Cervical Position: Posterior Station: -1 Presentation: Vertex Exam by:: Alma Friendly Chayim Bialas CNM  FHT: baseline 125, moderate variability, pos accel, prolonged decel noted,  Toco:irregular   Results for orders placed or performed during the hospital encounter of 02/06/22 (from the past 24 hour(s))  Protein / creatinine ratio, urine     Status: Abnormal   Collection Time: 02/06/22 11:25 PM  Result Value Ref Range   Creatinine, Urine 169 mg/dL   Total Protein, Urine 2,196 mg/dL   Protein Creatinine Ratio 12.99 (H) 0.00 - 0.15 mg/mg[Cre]  Urine Drug Screen, Qualitative ([facility] only)     Status: None   Collection Time: 02/06/22 11:25 PM  Result Value Ref Range   Tricyclic, Ur Screen NONE DETECTED NONE DETECTED   Amphetamines, Ur Screen NONE DETECTED NONE DETECTED   MDMA (Ecstasy)Ur Screen NONE DETECTED NONE DETECTED   Cocaine Metabolite,Ur [place] NONE DETECTED NONE DETECTED   Opiate, Ur Screen NONE DETECTED NONE DETECTED   Phencyclidine (PCP) Ur S NONE DETECTED NONE DETECTED   Cannabinoid 50 Ng, Ur [place] NONE DETECTED NONE DETECTED   Barbiturates, Ur Screen NONE DETECTED NONE DETECTED   Benzodiazepine, Ur Scrn NONE DETECTED NONE DETECTED   Methadone Scn, Ur NONE DETECTED NONE DETECTED  CBC     Status: Abnormal   Collection Time: 02/07/22 12:28 AM  Result Value Ref Range   WBC 8.6 4.0 - 10.5 K/uL   RBC 3.91 3.87 - 5.11 MIL/uL   Hemoglobin 9.6 (L) 12.0 - 15.0 g/dL   HCT 30.4 (L) 36.0 - 46.0 %   MCV  77.7 (L) 80.0 - 100.0 fL   MCH 24.6 (L) 26.0 - 34.0 pg   MCHC 31.6 30.0 - 36.0 g/dL   RDW 14.5 11.5 - 15.5 %   Platelets 224 150 - 400 K/uL   nRBC 0.0 0.0 - 0.2 %  Comprehensive metabolic panel     Status: Abnormal   Collection Time: 02/07/22 12:28 AM  Result Value Ref Range   Sodium 137 135 - 145 mmol/L   Potassium 4.6 3.5 - 5.1 mmol/L   Chloride 108 98 - 111 mmol/L   CO2 23 22 - 32 mmol/L   Glucose, Bld 97 70 - 99 mg/dL   BUN 16 6 - 20 mg/dL   Creatinine, Ser 0.81 0.44 - 1.00 mg/dL   Calcium 8.5 (L) 8.9 - 10.3 mg/dL   Total Protein 5.5 (L) 6.5 - 8.1 g/dL   Albumin 2.2 (L) 3.5 - 5.0 g/dL   AST 17 15 - 41 U/L   ALT 10 0 - 44 U/L   Alkaline Phosphatase 151 (H) 38 - 126 U/L   Total Bilirubin 0.3 0.3 - 1.2 mg/dL   GFR, Estimated >60 >60 mL/min   Anion gap 6 5 - 15  ABO/Rh     Status: None   Collection Time: 02/07/22 12:28 AM  Result Value Ref Range   ABO/RH(D)      O POS Performed at [facility], [address], [place],  [address]  Type and screen [place]     Status: None   Collection Time: 02/07/22  2:26 AM  Result Value Ref Range   ABO/RH(D) O POS    Antibody Screen NEG    Sample Expiration      02/10/2022,2359 Performed at [facility], [address] [place], [place] [address]   RPR     Status: None   Collection Time: 02/07/22  2:26 AM  Result Value Ref Range   RPR Ser Ql NON REACTIVE NON REACTIVE  Rapid HIV screen (HIV 1/2 Ab+Ag) ([facility] Only)     Status: None   Collection Time: 02/07/22  2:26 AM  Result Value Ref Range   HIV-1 P24 Antigen - HIV24 NON REACTIVE NON REACTIVE   HIV 1/2 Antibodies NON REACTIVE NON REACTIVE   Interpretation (HIV Ag Ab)      A non reactive test result means that HIV 1 or HIV 2 antibodies and HIV 1 p24 antigen were not detected in the specimen.  HIV Antibody (routine testing w rflx)     Status: None   Collection Time: 02/07/22  2:33 AM  Result Value Ref Range   HIV Screen 4th  Generation wRfx Non Reactive Non Reactive    Assessment:   21 y.o. G2P0010 [redacted]w[redacted]d admitted for severe preeclampsia   Plan:   1) Labor -Cytotec #2 placed around 1900, FB out at [place] when able  AROM when able   2) Fetus - category 2 tracing. Betamethasone #2 due after 0230   3) GBS positive-PCN started,  Membranes intact   4) Pain management: pt aware of all options, will ask when desired.   5) Severe Preeclampsia- 1 severe range pressure, normal at 15 min recheck  Reviewed strip with Dr Leafy Ro.   Roberto Scales, CNM  [place]osetta Pigeon, [facility]  @TODAY @  10:10 PM

## 2022-02-07 NOTE — H&P (Signed)
OB History & Physical   History of Present Illness:  Chief Complaint:   HPI:  Pamela Glenn is a 21 y.o. G87P0010 female at [redacted]w[redacted]d dated by 6wkUS.  She presents to L&D for evaluation for preeclampsia.  Pamela Glenn was monitoring her blood pressure at home, her BP's were 157/105, 179/116,186/122. With the  elevated BP's she decided to come in for an evaluation. She denies any HA, visual disturbances, or RUQ pain.  She does tend to "see spots" in the mornings.  She endorses +FM, denies contractions. Pamela Glenn was evaluated in L and D Triage on October 4 for preeclampsia, her labs were WNL and her blood pressure's normalized to 130's/80's, she was started on Labetalol and discharge home. Pamela Glenn has take both doses of Labetalol, her last dose was around 7 or 8 PM.  Today her UPC is 12.99.  Pamela Glenn has had early and regular prenatal care.    Pregnancy Issues: 1. Preeclampsia 2. BMI 34.5 3. GBS positive    Maternal Medical History:   Past Medical History:  Diagnosis Date   Medical history non-contributory    Patient denies medical problems     Past Surgical History:  Procedure Laterality Date   denies      NO PAST SURGERIES      No Known Allergies  Prior to Admission medications   Medication Sig Start Date End Date Taking? Authorizing Provider  Blood Pressure Monitoring (BLOOD PRESSURE MONITOR/L CUFF) MISC 1 Units by Does not apply route daily. 02/03/22  Yes Swanson, Efraim Kaufmann M, CNM  labetalol (NORMODYNE) 100 MG tablet Take 1 tablet (100 mg total) by mouth 2 (two) times daily. 02/03/22  Yes Glenetta Borg, CNM  Prenatal MV & Min w/FA-DHA (ALIVE PRENATAL PO) Take by mouth.   Yes [provider]  Iron-FA-B Cmp-C-Biot-Probiotic (FUSION PLUS) CAPS Take 1 tablet by mouth daily. 02/03/22   Glenetta Borg, CNM     Prenatal care site: [facility]   Social History: She  reports that she has never smoked. She has never been exposed to tobacco smoke. She has never used smokeless  tobacco. She reports that she does not currently use alcohol. She reports that she does not currently use drugs after having used the following drugs: Marijuana.  Family History: family history includes Breast cancer in her maternal grandmother; COPD in her maternal grandfather and maternal grandmother; Leukemia in her paternal grandfather; Lung cancer in her paternal great-grandfather.   Review of Systems: A full review of systems was performed and negative except as noted in the HPI.     Physical Exam:  Vital Signs: BP (!) 156/98   Pulse 89   Temp 98.5 F (36.9 C) (Oral)   Resp 18   Ht 4\' 11"  (1.499 m)   Wt 77.6 kg   LMP 05/18/2021 (Exact Date)   SpO2 100%   BMI 34.54 kg/m  General: no acute distress.  HEENT: normocephalic, atraumatic Heart: regular rate & rhythm.  No murmurs/rubs/gallops Lungs: clear to auscultation bilaterally, normal respiratory effort Abdomen: soft, gravid, non-tender;  EFW: 5.5lbs  Pelvic:   External: Normal external female genitalia  Cervix: Dilation: 1 / Effacement (%): 50 / Station: -2    Extremities: non-tender, symmetric, +3 edema bilaterally.  DTRs: +2  Neurologic: Alert & oriented x 3.    Results for orders placed or performed during the hospital encounter of 02/06/22 (from the past 24 hour(s))  Protein / creatinine ratio, urine     Status: Abnormal   Collection Time: 02/06/22  11:25 PM  Result Value Ref Range   Creatinine, Urine 169 mg/dL   Total Protein, Urine 2,196 mg/dL   Protein Creatinine Ratio 12.99 (H) 0.00 - 0.15 mg/mg[Cre]  CBC     Status: Abnormal   Collection Time: 02/07/22 12:28 AM  Result Value Ref Range   WBC 8.6 4.0 - 10.5 K/uL   RBC 3.91 3.87 - 5.11 MIL/uL   Hemoglobin 9.6 (L) 12.0 - 15.0 g/dL   HCT 30.4 (L) 36.0 - 46.0 %   MCV 77.7 (L) 80.0 - 100.0 fL   MCH 24.6 (L) 26.0 - 34.0 pg   MCHC 31.6 30.0 - 36.0 g/dL   RDW 14.5 11.5 - 15.5 %   Platelets 224 150 - 400 K/uL   nRBC 0.0 0.0 - 0.2 %  Comprehensive metabolic panel      Status: Abnormal   Collection Time: 02/07/22 12:28 AM  Result Value Ref Range   Sodium 137 135 - 145 mmol/L   Potassium 4.6 3.5 - 5.1 mmol/L   Chloride 108 98 - 111 mmol/L   CO2 23 22 - 32 mmol/L   Glucose, Bld 97 70 - 99 mg/dL   BUN 16 6 - 20 mg/dL   Creatinine, Ser 0.81 0.44 - 1.00 mg/dL   Calcium 8.5 (L) 8.9 - 10.3 mg/dL   Total Protein 5.5 (L) 6.5 - 8.1 g/dL   Albumin 2.2 (L) 3.5 - 5.0 g/dL   AST 17 15 - 41 U/L   ALT 10 0 - 44 U/L   Alkaline Phosphatase 151 (H) 38 - 126 U/L   Total Bilirubin 0.3 0.3 - 1.2 mg/dL   GFR, Estimated >60 >60 mL/min   Anion gap 6 5 - 15    Pertinent Results:  Prenatal Labs: Blood type/Rh O +   Antibody screen neg  Rubella Immune  Varicella Immune  RPR NR  HBsAg Neg  HIV NR  GC neg  Chlamydia neg  Genetic screening negative  1 hour GTT 98  3 hour GTT   GBS Positive    TKZ:SWFUXNAT 120, moderate variability, pos accel, neg decel  TOCO:irregular  SVE:  Dilation: 1 / Effacement (%): 35 / Station: -2    Cephalic by leopolds  No results found.  Assessment:  Pamela Glenn is a 21 y.o. G2P0010 female at [redacted]w[redacted]d with preeclampsia.   Plan:  Admit to Labor & Delivery CBC, T&S, Clrs, IVF GBS  pos, PCN ordered for induction  Consents obtained. Continuous efm/toco Betamethasone given, repeat in 4 hours Plan induction 12 hours from first dose of Betamethasone Reviewed Assessment and Plan with Dr Leafy Ro and then pt, pt agreeable to plan.  ----- Roberto Scales, [place]

## 2022-02-08 ENCOUNTER — Inpatient Hospital Stay: Payer: 59 | Admitting: General Practice

## 2022-02-08 DIAGNOSIS — O99820 Streptococcus B carrier state complicating pregnancy: Secondary | ICD-10-CM

## 2022-02-08 DIAGNOSIS — O1414 Severe pre-eclampsia complicating childbirth: Principal | ICD-10-CM

## 2022-02-08 DIAGNOSIS — Z3A35 35 weeks gestation of pregnancy: Secondary | ICD-10-CM

## 2022-02-08 MED ORDER — SIMETHICONE 80 MG PO CHEW: 80.0000 mg | CHEWABLE_TABLET | ORAL | Status: DC | PRN

## 2022-02-08 MED ORDER — DIBUCAINE (PERIANAL) 1 % EX OINT
1.0000 | TOPICAL_OINTMENT | CUTANEOUS | Status: DC | PRN
  Administered 2022-02-08 – 2022-02-10 (×2): 1 via RECTAL
  Filled 2022-02-08 (×2): qty 28

## 2022-02-08 MED ORDER — MAGNESIUM SULFATE 40 GM/1000ML IV SOLN
2.0000 g/h | INTRAVENOUS | Status: DC
  Administered 2022-02-09: 2 g/h via INTRAVENOUS
  Filled 2022-02-08: qty 1000

## 2022-02-08 MED ORDER — CALCIUM GLUCONATE 10 % IV SOLN
INTRAVENOUS | Status: AC
  Filled 2022-02-08: qty 10

## 2022-02-08 MED ORDER — LACTATED RINGERS IV SOLN: INTRAVENOUS | Status: DC

## 2022-02-08 MED ORDER — MAGNESIUM SULFATE BOLUS VIA INFUSION
4.0000 g | Freq: Once | INTRAVENOUS | Status: AC
  Administered 2022-02-08: 4 g via INTRAVENOUS
  Filled 2022-02-08: qty 1000

## 2022-02-08 MED ORDER — LABETALOL HCL 100 MG PO TABS: 100.0000 mg | ORAL_TABLET | Freq: Two times a day (BID) | ORAL | Status: DC

## 2022-02-08 MED ORDER — EPHEDRINE 5 MG/ML INJ: 10.0000 mg | INTRAVENOUS | Status: DC | PRN

## 2022-02-08 MED ORDER — BENZOCAINE-MENTHOL 20-0.5 % EX AERO
1.0000 | INHALATION_SPRAY | CUTANEOUS | Status: DC | PRN
  Administered 2022-02-08 – 2022-02-10 (×2): 1 via TOPICAL
  Filled 2022-02-08 (×2): qty 56

## 2022-02-08 MED ORDER — ONDANSETRON HCL 4 MG/2ML IJ SOLN: 4.0000 mg | INTRAMUSCULAR | Status: DC | PRN

## 2022-02-08 MED ORDER — LABETALOL HCL 200 MG PO TABS
200.0000 mg | ORAL_TABLET | Freq: Two times a day (BID) | ORAL | Status: DC
  Administered 2022-02-08 – 2022-02-10 (×4): 200 mg via ORAL
  Filled 2022-02-08 (×2): qty 1
  Filled 2022-02-08 (×2): qty 2

## 2022-02-08 MED ORDER — SODIUM CHLORIDE 0.9 % IV SOLN
INTRAVENOUS | Status: DC | PRN
  Administered 2022-02-08: 8 mL via EPIDURAL

## 2022-02-08 MED ORDER — LABETALOL HCL 5 MG/ML IV SOLN: 40.0000 mg | INTRAVENOUS | Status: DC | PRN

## 2022-02-08 MED ORDER — DIPHENHYDRAMINE HCL 50 MG/ML IJ SOLN: 12.5000 mg | INTRAMUSCULAR | Status: DC | PRN

## 2022-02-08 MED ORDER — WITCH HAZEL-GLYCERIN EX PADS
1.0000 | MEDICATED_PAD | CUTANEOUS | Status: DC | PRN
  Administered 2022-02-08 – 2022-02-10 (×2): 1 via TOPICAL
  Filled 2022-02-08 (×2): qty 100

## 2022-02-08 MED ORDER — PHENYLEPHRINE 80 MCG/ML (10ML) SYRINGE FOR IV PUSH (FOR BLOOD PRESSURE SUPPORT): 80.0000 ug | PREFILLED_SYRINGE | INTRAVENOUS | Status: DC | PRN

## 2022-02-08 MED ORDER — LACTATED RINGERS IV SOLN: 500.0000 mL | Freq: Once | INTRAVENOUS | Status: DC

## 2022-02-08 MED ORDER — TETANUS-DIPHTH-ACELL PERTUSSIS 5-2.5-18.5 LF-MCG/0.5 IM SUSY
0.5000 mL | PREFILLED_SYRINGE | Freq: Once | INTRAMUSCULAR | Status: DC
  Filled 2022-02-08: qty 0.5

## 2022-02-08 MED ORDER — LABETALOL HCL 5 MG/ML IV SOLN: 80.0000 mg | INTRAVENOUS | Status: DC | PRN

## 2022-02-08 MED ORDER — SENNOSIDES-DOCUSATE SODIUM 8.6-50 MG PO TABS
2.0000 | ORAL_TABLET | Freq: Every day | ORAL | Status: DC
  Administered 2022-02-09 – 2022-02-10 (×2): 2 via ORAL
  Filled 2022-02-08 (×2): qty 2

## 2022-02-08 MED ORDER — ACETAMINOPHEN 325 MG PO TABS
650.0000 mg | ORAL_TABLET | ORAL | Status: DC | PRN
  Administered 2022-02-08 – 2022-02-10 (×4): 650 mg via ORAL
  Filled 2022-02-08 (×4): qty 2

## 2022-02-08 MED ORDER — FENTANYL-BUPIVACAINE-NACL 0.5-0.125-0.9 MG/250ML-% EP SOLN
12.0000 mL/h | EPIDURAL | Status: DC | PRN
  Administered 2022-02-08: 12 mL/h via EPIDURAL
  Filled 2022-02-08: qty 250

## 2022-02-08 MED ORDER — DIPHENHYDRAMINE HCL 25 MG PO CAPS: 25.0000 mg | ORAL_CAPSULE | Freq: Four times a day (QID) | ORAL | Status: DC | PRN

## 2022-02-08 MED ORDER — HYDRALAZINE HCL 20 MG/ML IJ SOLN: 10.0000 mg | INTRAMUSCULAR | Status: DC | PRN

## 2022-02-08 MED ORDER — ONDANSETRON HCL 4 MG PO TABS: 4.0000 mg | ORAL_TABLET | ORAL | Status: DC | PRN

## 2022-02-08 MED ORDER — MAGNESIUM SULFATE 40 GM/1000ML IV SOLN
INTRAVENOUS | Status: AC
  Filled 2022-02-08: qty 1000

## 2022-02-08 MED ORDER — PRENATAL MULTIVITAMIN CH
1.0000 | ORAL_TABLET | Freq: Every day | ORAL | Status: DC
  Administered 2022-02-09 – 2022-02-10 (×2): 1 via ORAL
  Filled 2022-02-08 (×2): qty 1

## 2022-02-08 MED ORDER — COCONUT OIL OIL
1.0000 | TOPICAL_OIL | Status: DC | PRN
  Administered 2022-02-10: 1 via TOPICAL
  Filled 2022-02-08: qty 15

## 2022-02-08 MED ORDER — IBUPROFEN 600 MG PO TABS
600.0000 mg | ORAL_TABLET | Freq: Four times a day (QID) | ORAL | Status: DC
  Administered 2022-02-08 – 2022-02-10 (×6): 600 mg via ORAL
  Filled 2022-02-08 (×6): qty 1

## 2022-02-08 MED ORDER — LABETALOL HCL 5 MG/ML IV SOLN: 20.0000 mg | INTRAVENOUS | Status: DC | PRN

## 2022-02-08 NOTE — Progress Notes (Signed)
Delayed note due to patient care  Pt admitted to triage with severely elevated BP at home. New onset proteinuria On 10/4= too low to count, on admission 12.99 (normal range 0-0.15mg /mg). Recent "spots" in vision but not currently. No headache. Notable pitting edema, worsening over the last 4 weeks. She is taking 100mg  labetalol BID at home.  On admission, BPS max 160 and BPD max 110. Recommend induction for PreE with severe features, start steroid course, start magnesium and administer iv antihypertensives per protocol for severe pressures.

## 2022-02-08 NOTE — Anesthesia Preprocedure Evaluation (Signed)
Anesthesia Evaluation  Patient identified by MRN, date of birth, ID band Patient awake    Reviewed: Allergy & Precautions, NPO status , Patient's Chart, lab work & pertinent test results  History of Anesthesia Complications Negative for: history of anesthetic complications  Airway Mallampati: III  TM Distance: >3 FB Neck ROM: full    Dental  (+) Chipped, Dental Advidsory Given   Pulmonary neg pulmonary ROS,    Pulmonary exam normal        Cardiovascular Exercise Tolerance: Good (-) Past MI and (-) CABG negative cardio ROS Normal cardiovascular exam     Neuro/Psych    GI/Hepatic negative GI ROS,   Endo/Other    Renal/GU   negative genitourinary   Musculoskeletal   Abdominal   Peds  Hematology negative hematology ROS (+)   Anesthesia Other Findings Past Medical History: No date: Medical history non-contributory No date: Patient denies medical problems  Past Surgical History: No date: denies  No date: NO PAST SURGERIES  BMI    Body Mass Index: 34.54 kg/m      Reproductive/Obstetrics (+) Pregnancy                             Anesthesia Physical Anesthesia Plan  ASA: 2  Anesthesia Plan: Epidural   Post-op Pain Management:    Induction:   PONV Risk Score and Plan:   Airway Management Planned: Natural Airway  Additional Equipment:   Intra-op Plan:   Post-operative Plan:   Informed Consent: I have reviewed the patients History and Physical, chart, labs and discussed the procedure including the risks, benefits and alternatives for the proposed anesthesia with the patient or authorized representative who has indicated his/her understanding and acceptance.     Dental Advisory Given  Plan Discussed with: Anesthesiologist  Anesthesia Plan Comments: (Patient reports no bleeding problems and no anticoagulant use.   Patient consented for risks of anesthesia including  but not limited to:  - adverse reactions to medications - risk of bleeding, infection and or nerve damage from epidural that could lead to paralysis - risk of headache or failed epidural - nerve damage due to positioning - that if epidural is used for C-section that there is a chance of epidural failure requiring spinal placement or conversion to GA - Damage to heart, brain, lungs, other parts of body or loss of life  Patient voiced understanding.)        Anesthesia Quick Evaluation

## 2022-02-08 NOTE — Discharge Summary (Signed)
OB Discharge Summary     Patient Name: Pamela Glenn DOB: 2000/09/26 MRN: [id number]  Date of admission: 02/06/2022 Delivering provider: Rod Can, CNM  Date of Delivery: 02/08/2022  Date of discharge: 02/08/2022  Admitting diagnosis: Preeclampsia [O14.90] Intrauterine pregnancy: [redacted]w[redacted]d     Secondary diagnosis: None     Discharge diagnosis: Preterm Pregnancy Delivered and Preeclampsia (severe)                                                                                                Post partum procedures:{Postpartum procedures:23558}  Augmentation: Pitocin, Cytotec, IP Foley, and N/A  Complications: None  Hospital course:  Induction of Labor With Vaginal Delivery   21 y.o. yo G2P0010 at [redacted]w[redacted]d was admitted to the hospital 02/06/2022 for induction of labor.  Indication for induction: Preeclampsia.  Patient had a labor course complicated by severe preeclampsia on IV magnesium sulfate. Membrane Rupture Time/Date: 5:10 AM ,02/08/2022   Delivery Method:Vaginal, Spontaneous  Episiotomy: None  Lacerations:  None  Details of delivery can be found in separate delivery note.    Patient had a postpartum course complicated by***.   Patient is discharged home 02/08/22.  Newborn Data: Birth date:02/08/2022  Birth time:11:32 AM  Gender:Female   [place] Living status:Living  Apgars:6 ,9  Weight:2510 g 5 pounds 8 ounces  Physical exam  Vitals:   02/08/22 1700 02/08/22 1847 02/08/22 1914 02/08/22 2012  BP:  (!) 157/102 (!) 146/94 (!) 147/98  Pulse:  (!) 105 (!) 101 94  Resp: 16  18 18   Temp:   98.2 F (36.8 C)   TempSrc:   Oral   SpO2: 99%  100%   Weight:      Height:       General: {Exam; general:21111117} Lochia: {Desc; appropriate/inappropriate:30686::"appropriate"} Uterine Fundus: {Desc; firm/soft:30687} Incision: N/A DVT Evaluation: {Exam; dvt:2111122}  Labs: Lab Results  Component Value Date   WBC 8.6 02/07/2022   HGB 9.6 (L) 02/07/2022   HCT 30.4 (L) 02/07/2022    MCV 77.7 (L) 02/07/2022   PLT 224 02/07/2022    Discharge instruction: per After Visit Summary.  Medications:  Allergies as of 02/08/2022   No Known Allergies   Med Rec must be completed prior to using this [place]***       Diet: routine diet  Activity: Advance as tolerated. Pelvic rest for 6 weeks.   Outpatient follow up:  Follow-up Information     Rod Can, CNM. Schedule an appointment as soon as possible for a visit in 2 week(s).   Specialty: Obstetrics Why: 2 weeks BP check visit and 6 weeks postpartum visit Contact information: [place] [address] [phone]                   Postpartum contraception: Progesterone only pills Rhogam Given postpartum: Rh positive Rubella vaccine given postpartum: immune Varicella vaccine given postpartum: immune TDaP given antepartum or postpartum: declined   Newborn Delivery   Birth date/time: 02/08/2022 11:32:00 Delivery type:       Baby Feeding: Breast  Disposition:{CHL IP OB HOME WITH JSEGBT:51761}  SIGNED:  Rod Can, CNM 02/08/2022  8:59 PM

## 2022-02-08 NOTE — Progress Notes (Signed)
Subjective:  Pt resting comfortably, was repositioned multiple times for decel. Now on right side.  Reviewed plan, pt agreeable to starting pitocin when able   Objective:   Vitals: Blood pressure 124/77, pulse (!) 109, temperature 98.6 F (37 C), temperature source Oral, resp. rate 16, height 4\' 11"  (1.499 m), weight 77.6 kg, last menstrual period 05/18/2021, SpO2 98 %, unknown if currently breastfeeding. General: NAD Abdomen:non tender  Cervical Exam:  Dilation: 3.5 Effacement (%): 70 Cervical Position: Posterior Station: -1 Presentation: Vertex Exam by:: Roberto Scales CNM  FHT: baseline 125, moderate variability, pos accel, occasional variable, 1 decel less 2 mins responded to position change.  Toco:q 1-4  Results for orders placed or performed during the hospital encounter of 02/06/22 (from the past 24 hour(s))  CBC     Status: Abnormal   Collection Time: 02/07/22 12:28 AM  Result Value Ref Range   WBC 8.6 4.0 - 10.5 K/uL   RBC 3.91 3.87 - 5.11 MIL/uL   Hemoglobin 9.6 (L) 12.0 - 15.0 g/dL   HCT 30.4 (L) 36.0 - 46.0 %   MCV 77.7 (L) 80.0 - 100.0 fL   MCH 24.6 (L) 26.0 - 34.0 pg   MCHC 31.6 30.0 - 36.0 g/dL   RDW 14.5 11.5 - 15.5 %   Platelets 224 150 - 400 K/uL   nRBC 0.0 0.0 - 0.2 %  Comprehensive metabolic panel     Status: Abnormal   Collection Time: 02/07/22 12:28 AM  Result Value Ref Range   Sodium 137 135 - 145 mmol/L   Potassium 4.6 3.5 - 5.1 mmol/L   Chloride 108 98 - 111 mmol/L   CO2 23 22 - 32 mmol/L   Glucose, Bld 97 70 - 99 mg/dL   BUN 16 6 - 20 mg/dL   Creatinine, Ser 0.81 0.44 - 1.00 mg/dL   Calcium 8.5 (L) 8.9 - 10.3 mg/dL   Total Protein 5.5 (L) 6.5 - 8.1 g/dL   Albumin 2.2 (L) 3.5 - 5.0 g/dL   AST 17 15 - 41 U/L   ALT 10 0 - 44 U/L   Alkaline Phosphatase 151 (H) 38 - 126 U/L   Total Bilirubin 0.3 0.3 - 1.2 mg/dL   GFR, Estimated >60 >60 mL/min   Anion gap 6 5 - 15  ABO/Rh     Status: None   Collection Time: 02/07/22 12:28 AM  Result Value  Ref Range   ABO/RH(D)      O POS Performed at [facility], [address] [place], [place] [address]   Type and screen [place]     Status: None   Collection Time: 02/07/22  2:26 AM  Result Value Ref Range   ABO/RH(D) O POS    Antibody Screen NEG    Sample Expiration      02/10/2022,2359 Performed at [facility], [address] [place], [place] [address]   RPR     Status: None   Collection Time: 02/07/22  2:26 AM  Result Value Ref Range   RPR Ser Ql NON REACTIVE NON REACTIVE  Rapid HIV screen (HIV 1/2 Ab+Ag) ([facility] Only)     Status: None   Collection Time: 02/07/22  2:26 AM  Result Value Ref Range   HIV-1 P24 Antigen - HIV24 NON REACTIVE NON REACTIVE   HIV 1/2 Antibodies NON REACTIVE NON REACTIVE   Interpretation (HIV Ag Ab)      A non reactive test result means that HIV 1 or  HIV 2 antibodies and HIV 1 p24 antigen were not detected in the specimen.  HIV Antibody (routine testing w rflx)     Status: None   Collection Time: 02/07/22  2:33 AM  Result Value Ref Range   HIV Screen 4th Generation wRfx Non Reactive Non Reactive    Assessment:   21 y.o. G2P0010 [redacted]w[redacted]d admitted for severe preeclampsia   Plan:   1) Labor -Cytotec #2 placed around 1900, FB out at 1927             Pitocin when able, will monitor tracing for 30 mins then start if able.             AROM when able    2) Fetus - category 2 tracing. Betamethasone #2 due after 0230   3) GBS positive-PCN started,  Membranes intact   4) Pain management: pt aware of all options, will ask when desired.     Carie Caddy, CNM  [place] Pulse, [facility]  @TODAY @  12:27 AM

## 2022-02-08 NOTE — Anesthesia Procedure Notes (Signed)
Epidural Patient location during procedure: OB Start time: 02/08/2022 6:00 AM End time: 02/08/2022 6:27 AM  Staffing Anesthesiologist: Dimas Millin, MD Performed: anesthesiologist   Preanesthetic Checklist Completed: patient identified, IV checked, site marked, risks and benefits discussed, surgical consent, monitors and equipment checked, pre-op evaluation and timeout performed  Epidural Patient position: sitting Prep: Betadine Patient monitoring: heart rate, continuous pulse ox and blood pressure Approach: midline Location: L3-L4 Injection technique: LOR saline  Needle:  Needle type: Tuohy  Needle gauge: 18 G Needle length: 9 cm and 9 Needle insertion depth: 7 cm Catheter type: closed end flexible Catheter size: 20 Guage Catheter at skin depth: 12 cm Test dose: negative and 1.5% lidocaine with Epi 1:200 K  Assessment Events: blood not aspirated, injection not painful, no injection resistance, no paresthesia and negative IV test  Additional Notes   Patient tolerated the insertion well without complications.Reason for block:procedure for pain

## 2022-02-08 NOTE — Progress Notes (Signed)
   Subjective:  S/p epidural, still feeling contractions.   Objective:   Vitals: Blood pressure (!) 145/86, pulse 94, temperature 98.7 F (37.1 C), temperature source Oral, resp. rate 16, height 4\' 11"  (1.499 m), weight 77.6 kg, last menstrual period 05/18/2021, SpO2 92 %, unknown if currently breastfeeding. General: NAD Abdomen:non tender Cervical Exam:  Dilation: 3.5 Effacement (%): 70, 80 Cervical Position: Posterior Station: -1 Presentation: Vertex Exam by:: Martinique Guptill RN  FHT: baseline 120, moderate to min variability, neg accel, early decel  Toco:q 1-5 Pitocin at 2 MU   No results found for this or any previous visit (from the past 24 hour(s)).  Assessment:   21 y.o. G2P0010 [redacted]w[redacted]d admitted for severe preeclampsia   Plan:   1) Labor -Cytotec #2 placed around 1900, FB out at [place] infusing   VE once pt comfortable with epidural    2) Fetus - category 2 tracing. Betamethasone complete  3) GBS positive-PCN started,  SROM for clear at 0510    4) Pain management: epidural managed by Anesthesia  5) Severe preeclampsia Magnesium infusing   Roberto Scales, CNM  [place]osetta Pigeon, [facility]  @TODAY @  7:38 AM

## 2022-02-09 LAB — CBC
HCT: 26.3 % — ABNORMAL LOW (ref 36.0–46.0)
Hemoglobin: 8.2 g/dL — ABNORMAL LOW (ref 12.0–15.0)
MCH: 24.3 pg — ABNORMAL LOW (ref 26.0–34.0)
MCHC: 31.2 g/dL (ref 30.0–36.0)
MCV: 77.8 fL — ABNORMAL LOW (ref 80.0–100.0)
Platelets: 195 10*3/uL (ref 150–400)
RBC: 3.38 MIL/uL — ABNORMAL LOW (ref 3.87–5.11)
RDW: 15.2 % (ref 11.5–15.5)
WBC: 14.9 10*3/uL — ABNORMAL HIGH (ref 4.0–10.5)
nRBC: 0 % (ref 0.0–0.2)

## 2022-02-09 MED ORDER — KETOROLAC TROMETHAMINE 30 MG/ML IJ SOLN: 30.0000 mg | Freq: Four times a day (QID) | INTRAMUSCULAR | Status: DC | PRN

## 2022-02-09 MED ORDER — SCOPOLAMINE 1 MG/3DAYS TD PT72: 1.0000 | MEDICATED_PATCH | Freq: Once | TRANSDERMAL | Status: DC

## 2022-02-09 MED ORDER — LACTATED RINGERS IV SOLN: INTRAVENOUS | Status: DC

## 2022-02-09 MED ORDER — SODIUM CHLORIDE 0.9% FLUSH: 3.0000 mL | INTRAVENOUS | Status: DC | PRN

## 2022-02-09 MED ORDER — NALOXONE HCL 0.4 MG/ML IJ SOLN: 0.4000 mg | INTRAMUSCULAR | Status: DC | PRN

## 2022-02-09 MED ORDER — NALOXONE HCL 4 MG/10ML IJ SOLN: 1.0000 ug/kg/h | INTRAVENOUS | Status: DC | PRN

## 2022-02-09 MED ORDER — ACETAMINOPHEN 500 MG PO TABS: 1000.0000 mg | ORAL_TABLET | Freq: Four times a day (QID) | ORAL | Status: DC

## 2022-02-09 MED ORDER — OXYCODONE HCL 5 MG PO TABS: 5.0000 mg | ORAL_TABLET | Freq: Four times a day (QID) | ORAL | Status: DC | PRN

## 2022-02-09 NOTE — Progress Notes (Signed)
Assisted Pamela Glenn with pumping and brought cotton tipped swabs soaked with colostrum to NICU for feeding. She ambulated with standby assist by RN to NICU to participate in 0600 infant feeding. She tolerated the activity well but desires to return to [facility] via wheelchair. Support person Darlyn Chamber accompanied Kelbie to NICU.

## 2022-02-09 NOTE — Anesthesia Postprocedure Evaluation (Signed)
Anesthesia Post Note  Patient: Pamela Glenn  Procedure(s) Performed: AN AD HOC LABOR EPIDURAL  Patient location during evaluation: Mother Baby Anesthesia Type: Epidural Level of consciousness: awake Respiratory status: spontaneous breathing Cardiovascular status: stable Postop Assessment: no headache Anesthetic complications: no   No notable events documented.   Last Vitals:  Vitals:   02/09/22 0630 02/09/22 0723  BP:  (!) 143/85  Pulse:  85  Resp: 18   Temp:    SpO2:      Last Pain:  Vitals:   02/09/22 0725  TempSrc:   PainSc: 4                  Lerry Liner

## 2022-02-09 NOTE — Progress Notes (Signed)
Progress Note - Vaginal Delivery  Pamela Glenn is a 21 y.o. [id number] now PP day 1 s/p Vaginal, Spontaneous .   Postpartum Magnesium sulfate therapy. To stop today @ 1130  Subjective:  The patient reports no complaints, voiding, tolerating PO, and feeling weak due to magnesium therapy, pt states she has been out of bed and has done well.     Objective:  Vital signs in last 24 hours: Temp:  [98.1 F (36.7 C)-98.7 F (37.1 C)] 98.3 F (36.8 C) (10/10 0517) Pulse Rate:  [73-125] 85 (10/10 0723) Resp:  [14-18] 18 (10/10 0730) BP: (114-157)/(64-102) 143/85 (10/10 0723) SpO2:  [92 %-100 %] 97 % (10/10 0730)  Physical Exam:  General: alert, cooperative, appears stated age, fatigued, and no distress Lochia: appropriate Uterine Fundus: firm  Reflexes 2+ bilaterally Clonus: negative Lungs clear bilaterally    Data Review Recent Labs    02/07/22 0028 02/09/22 0502  HGB 9.6* 8.2*  HCT 30.4* 26.3*    Assessment/Plan: Principal Problem:   Preeclampsia Active Problems:   Labor and delivery, indication for care   Postpartum care following vaginal delivery   Encounter for care or examination of lactating mother   Plan for discharge tomorrow   -- Continue routine PP care.     Philip Aspen, CNM  02/09/2022 9:39 AM

## 2022-02-09 NOTE — Progress Notes (Signed)
Assisted Colletta with pumping. Drops of colostrum collected on cotton tipped swabs and delivered to NICU RN.

## 2022-02-10 ENCOUNTER — Ambulatory Visit: Payer: Self-pay

## 2022-02-10 MED ORDER — ACETAMINOPHEN 325 MG PO TABS: 650.0000 mg | ORAL_TABLET | ORAL | Status: DC | PRN

## 2022-02-10 MED ORDER — IBUPROFEN 600 MG PO TABS: 600.0000 mg | ORAL_TABLET | Freq: Four times a day (QID) | ORAL | 0 refills | Status: DC | PRN

## 2022-02-10 MED ORDER — FERROUS SULFATE 325 (65 FE) MG PO TABS: 325.0000 mg | ORAL_TABLET | Freq: Two times a day (BID) | ORAL | 1 refills | Status: DC

## 2022-02-10 MED ORDER — PRENATAL MULTIVITAMIN CH: 1.0000 | ORAL_TABLET | Freq: Every day | ORAL | Status: DC

## 2022-02-10 MED ORDER — LABETALOL HCL 200 MG PO TABS: 200.0000 mg | ORAL_TABLET | Freq: Two times a day (BID) | ORAL | 1 refills | Status: DC

## 2022-02-10 NOTE — Progress Notes (Signed)
Patient discharged home. FOB present at discharge. Infant still patient in SCN. Discharge instructions and prescriptions given and reviewed with patient. Patient verbalized understanding.   Follow-up appointments scheduled for 2-weeks and 6-weeks. CNM prefers 1-week BP check and explained this to patient, for her to call once they reopen from lunch later. She verbalized understanding.   Went over important signs and symptoms of high BP and when to call or come to ER.   Also provided pt with SCN phone number and explained she can come and visit 24/7.   Pt was seen by lactation prior to discharge.   Will be escorted out by volunteers.

## 2022-02-10 NOTE — Lactation Note (Signed)
This note was copied from a baby's chart. Lactation Consultation Note  Patient Name: Pamela Glenn OACZY'S Date: 02/10/2022 Reason for consult: Mother's request;RN request;Nipple pain/trauma Age:20 hours  Maternal Data Has patient been taught Hand Expression?: Yes  Feeding Mother's Current Feeding Choice: Breast Milk  LATCH Score                    Lactation Tools Discussed/Used Breast pump type: Double-Electric Breast Pump ([employer] brand wearables with 10mm flanges. Also has "WIC pump in a green box". Encouraged to bring in both tomorrow for flange sizing and assistance from [facility].) Pump Education: Other (comment) Pumping frequency: Encouraged to pump 10-15 minutes every 2-3 hours during the day and q 4-5hr at night while baby is in SCN. Irregular pumping schedule at present and has been fatigued from birth and discharge home today. Pumped volume:  (some leaking spontaneously and drops with compression)  Interventions Interventions:  (Encouraged coconut oil on nipples and flanges. DEBP q 2-3 hr and to be sized for flanges when she brings her pump in on 10/12 by IBCLC. Rest and self-care encouraged. Anticipatory guidance for engorgement care offered. Grandmother present and supportive.)  Discharge    Consult Status      Lenore Cordia 02/10/2022, 7:15 PM

## 2022-02-10 NOTE — Discharge Instructions (Addendum)
Discharge Instructions:   Follow-up Appointments: 2 week BP check visit: Wednesday, 10/25 at 1:55pm with Rod Can, CNM 6 week postpartum visit: Monday, 11/20 at 9:35am with Rod Can, CNM   If there are any new medications, they have been ordered and will be available for pickup at the listed pharmacy on your way home from the hospital.   Call office if you have any of the following: headache, visual changes, fever >101.0 F, chills, shortness of breath, breast concerns, excessive vaginal bleeding, incision drainage or problems, leg pain or redness, depression or any other concerns. If you have vaginal discharge with an odor, let your doctor know.   It is normal to bleed for up to 6 weeks. You should not soak through more than 1 pad in 1 hour. If you have a blood clot larger than your fist with continued bleeding, call your doctor.   Activity: Do not lift > 10 lbs for 6 weeks (do not lift anything heavier than your baby). No intercourse, tampons, swimming pools, hot tubs, baths (only showers) for 6 weeks.  No driving for 1-2 weeks. Continue prenatal vitamin, especially if breastfeeding. Increase calories and fluids (water) while breastfeeding.   Your milk will come in, in the next couple of days (right now it is colostrum). You may have a slight fever when your milk comes in, but it should go away on its own.  If it does not, and rises above 101 F please call the doctor. You will also feel achy and your breasts will be firm. They will also start to leak. If you are breastfeeding, continue as you have been and you can pump/express milk for comfort.   If you have too much milk, your breasts can become engorged, which could lead to mastitis. This is an infection of the milk ducts. It can be very painful and you will need to notify your doctor to obtain a prescription for antibiotics. You can also treat it with a shower or hot/cold compress.   For concerns about your baby, please call your  pediatrician.  For breastfeeding concerns, the lactation consultant can be reached at [phone].   Postpartum blues (feelings of happy one minute and sad another minute) are normal for the first few weeks but if it gets worse let your doctor know.   Congratulations! We enjoyed caring for you and your new bundle of joy!

## 2022-02-10 NOTE — Progress Notes (Signed)
Pt declined TDAP and flu vaccines offered by nurse 02/10/22 at 0830.

## 2022-02-10 NOTE — Lactation Note (Signed)
This note was copied from a baby's chart. Lactation Consultation Note  Patient Name: Pamela Glenn Date: 02/10/2022 Reason for consult: Follow-up assessment;Primapara;Late-preterm 34-36.6wks;Infant < 6lbs;Maternal discharge Age:20 hours  Maternal Data Has patient been taught Hand Expression?: Yes Does the patient have breastfeeding experience prior to this delivery?: No  Mom is being discharged today. Baby remains in SCN.  Feeding Mother's Current Feeding Choice: Breast Milk  Baby has NG tube and has been allowed to lick N Learn/latch with mom and do skin to skin.  LATCH Score    Lactation Tools Discussed/Used Tools: Pump Breast pump type: Double-Electric Breast Pump Pump Education: Setup, frequency, and cleaning;Milk Storage Reason for Pumping: SCN Pumping frequency: q 3 hrs  Tips given for optimal pumping technique and expression. Encouraged consistency of pumping, achieving the appropriate suction level, how to determine appropriate flange size, hands on pumping, and hand expression pre/post pumping.  Interventions Interventions: Education;DEBP;Hand express  Discharge Discharge Education: Engorgement and breast care Pump: Hands Free  Discussed the benefits of EBP that plugs into the wall vs hands free, the availability of a DEBP at baby's bedside in SCN and encouraged use during visits.  Consult Status    Encouraged to seek [facility] support when visiting for pumping questions and breastfeeding support at bedside during lick and learn times.  Lavonia Drafts 02/10/2022, 9:06 AM

## 2022-02-12 ENCOUNTER — Encounter: Payer: 59 | Admitting: Licensed Practical Nurse

## 2022-02-15 ENCOUNTER — Ambulatory Visit (INDEPENDENT_AMBULATORY_CARE_PROVIDER_SITE_OTHER): Payer: 59

## 2022-02-15 VITALS — BP 114/88 | HR 102 | Resp 16 | Ht 59.0 in | Wt 146.9 lb

## 2022-02-15 DIAGNOSIS — O165 Unspecified maternal hypertension, complicating the puerperium: Secondary | ICD-10-CM

## 2022-02-15 DIAGNOSIS — Z0130 Encounter for examination of blood pressure without abnormal findings: Secondary | ICD-10-CM

## 2022-02-15 NOTE — Progress Notes (Signed)
    Nurse Visit Note  Subjective:    Patient ID: Pamela Glenn, female    DOB: [birth year], 21 y.o.   MRN: [id number]  HPI  Patient is a 21 y.o. [phone] female who presents for blood pressure check. Patient denies chest pain, palpitations, shortness of breath or visual disturbances. Her blood pressure was elevated during labor and delivery.  Today during nurse visit first check blood pressure was 114/88 with a heart rate of 102. She does take Labetalol and she has not missed any doses. She said her blood pressures have been within normal range at home.

## 2022-02-24 ENCOUNTER — Ambulatory Visit (INDEPENDENT_AMBULATORY_CARE_PROVIDER_SITE_OTHER): Payer: 59 | Admitting: Advanced Practice Midwife

## 2022-02-24 ENCOUNTER — Encounter: Payer: Self-pay | Admitting: Advanced Practice Midwife

## 2022-02-24 VITALS — BP 110/76 | HR 100 | Ht 59.0 in | Wt 135.0 lb

## 2022-02-24 DIAGNOSIS — O165 Unspecified maternal hypertension, complicating the puerperium: Secondary | ICD-10-CM

## 2022-02-24 DIAGNOSIS — Z0130 Encounter for examination of blood pressure without abnormal findings: Secondary | ICD-10-CM

## 2022-02-24 NOTE — Progress Notes (Signed)
Obstetrics & Gynecology Office Visit   Chief Complaint:  Chief Complaint  Patient presents with   Blood Pressure Check    History of Present Illness: 21 y.o. [id number] being seen for follow up blood pressure check today.  The patient is  2 weeks postpartum The established diagnosis for the patient is preeclampsia without severe features.  She is currently on labetalol 200 mg.  She reports no current symptoms attributable to her blood pressure.  Medication list reviewed medications which may contribute to BP elevation were not noted. She reports doing well generally and her baby will be discharged from the [facility] soon.  Review of Systems: Review of Systems  Constitutional:  Negative for chills and fever.  HENT:  Negative for congestion, ear discharge, ear pain, hearing loss, sinus pain and sore throat.   Eyes:  Negative for blurred vision and double vision.  Respiratory:  Negative for cough, shortness of breath and wheezing.   Cardiovascular:  Negative for chest pain, palpitations and leg swelling.  Gastrointestinal:  Negative for abdominal pain, blood in stool, constipation, diarrhea, heartburn, melena, nausea and vomiting.  Genitourinary:  Negative for dysuria, flank pain, frequency, hematuria and urgency.  Musculoskeletal:  Negative for back pain, joint pain and myalgias.  Skin:  Negative for itching and rash.  Neurological:  Negative for dizziness, tingling, tremors, sensory change, speech change, focal weakness, seizures, loss of consciousness, weakness and headaches.  Endo/Heme/Allergies:  Negative for environmental allergies. Does not bruise/bleed easily.  Psychiatric/Behavioral:  Negative for depression, hallucinations, memory loss, substance abuse and suicidal ideas. The patient is not nervous/anxious and does not have insomnia.      Past Medical History:  Past Medical History:  Diagnosis Date   Medical history non-contributory    Patient denies medical problems     Past  Surgical History:  Past Surgical History:  Procedure Laterality Date   denies      NO PAST SURGERIES      Gynecologic History: No LMP recorded.  Obstetric History: G2P0111  Family History:  Family History  Problem Relation Age of Onset   COPD Maternal Grandmother    Breast cancer Maternal Grandmother    COPD Maternal Grandfather    Leukemia Paternal Grandfather    Lung cancer Paternal Great-grandfather     Social History:  Social History   Socioeconomic History   Marital status: Single    Spouse name: Not on file   Number of children: Not on file   Years of education: Not on file   Highest education level: Not on file  Occupational History   Not on file  Tobacco Use   Smoking status: Never    Passive exposure: Never   Smokeless tobacco: Never  Vaping Use   Vaping Use: Former   Substances: Nicotine  Substance and Sexual Activity   Alcohol use: Not Currently    Comment: last use 12/2020- bottle of vodka   Drug use: Not Currently    Types: Marijuana   Sexual activity: Not Currently    Comment: hx of ocp, stopped 2021, prob with depression  Other Topics Concern   Not on file  Social History Narrative   Not on file   Social Determinants of Health   Financial Resource Strain: Not on file  Food Insecurity: No Food Insecurity (02/07/2022)   Hunger Vital Sign    Worried About Running Out of Food in the Last Year: Never true    Ran Out of Food in the Last Year:  Never true  Transportation Needs: No Transportation Needs (02/07/2022)   PRAPARE - Hydrologist (Medical): No    Lack of Transportation (Non-Medical): No  Physical Activity: Not on file  Stress: Not on file  Social Connections: Not on file  Intimate Partner Violence: Not At Risk (02/07/2022)   Humiliation, Afraid, Rape, and Kick questionnaire    Fear of Current or Ex-Partner: No    Emotionally Abused: No    Physically Abused: No    Sexually Abused: No    Allergies:  No  Known Allergies  Medications: Prior to Admission medications   Medication Sig Start Date End Date Taking? Authorizing Provider  acetaminophen (TYLENOL) 325 MG tablet Take 2 tablets (650 mg total) by mouth every 4 (four) hours as needed (for pain scale < 4). 02/10/22  Yes Dominic, Nunzio Cobbs, CNM  Blood Pressure Monitoring (BLOOD PRESSURE MONITOR/L CUFF) MISC 1 Units by Does not apply route daily. 02/03/22  Yes Swanson, Ian Bushman, CNM  ferrous sulfate (FERROUSUL) 325 (65 FE) MG tablet Take 1 tablet (325 mg total) by mouth 2 (two) times daily. 02/10/22  Yes Dominic, Nunzio Cobbs, CNM  labetalol (NORMODYNE) 200 MG tablet Take 1 tablet (200 mg total) by mouth 2 (two) times daily. 02/10/22  Yes Dominic, Nunzio Cobbs, CNM  Prenatal Vit-Fe Fumarate-FA (PRENATAL MULTIVITAMIN) TABS tablet Take 1 tablet by mouth daily at 12 noon. 02/11/22  Yes Dominic, Nunzio Cobbs, CNM    Physical Exam Blood pressure 110/76, pulse 100, height 4\' 11"  (1.499 m), weight 135 lb (61.2 kg), not currently breastfeeding.  No LMP recorded.  General: NAD HEENT: normocephalic, anicteric Pulmonary: No increased work of breathing Cardiovascular: RRR, distal pulses 2+ Extremities: noedema, no erythema, no tenderness Neurologic: Grossly intact Psychiatric: mood appropriate, affect full  Assessment: 21 y.o. [id number] presenting for blood pressure evaluation today  Plan: Problem List Items Addressed This Visit   None Visit Diagnoses     BP check    -  Primary       1) Blood pressure - blood pressure at today's visit is normotensive.  As a result  weaning from medication discussed . - additional blood work was not obtained 2) Return for 6 week postpartum visit and as needed   Rod Can, [facility] 02/24/2022, 2:46 PM

## 2022-03-22 ENCOUNTER — Ambulatory Visit (INDEPENDENT_AMBULATORY_CARE_PROVIDER_SITE_OTHER): Payer: 59 | Admitting: Advanced Practice Midwife

## 2022-03-22 ENCOUNTER — Encounter: Payer: Self-pay | Admitting: Advanced Practice Midwife

## 2022-03-22 DIAGNOSIS — Z30011 Encounter for initial prescription of contraceptive pills: Secondary | ICD-10-CM | POA: Diagnosis not present

## 2022-03-22 MED ORDER — NORETHIN ACE-ETH ESTRAD-FE 1-20 MG-MCG PO TABS: 1.0000 | ORAL_TABLET | Freq: Every day | ORAL | 4 refills | Status: DC

## 2022-03-22 NOTE — Progress Notes (Signed)
Postpartum Visit  Chief Complaint:  Chief Complaint  Patient presents with   Postpartum Care    History of Present Illness: Patient is a 21 y.o. [id number] presents for postpartum visit.  Review the Delivery Report for details.   Date of delivery: 02/08/2022 Type of delivery: Vaginal delivery - Vacuum or forceps assisted  no Episiotomy No.  Laceration: Right labial abrasion hemostatic/no repair  Pregnancy or labor problems:  no Any problems since the delivery:  She reports normal BP readings on home cuff since discontinuing Labetalol  Newborn Details:  SINGLETON :  1. BabyGender female. Birth weight: 5 pounds 8 ounces Maternal Details:  Breast or formula feeding: formula feeding Intercourse: No  Contraception after delivery:  OCP Any bowel or bladder issues:  hemorrhoid/comfort measures reviewed Post partum depression/anxiety noted:  no Edinburgh Post-Partum Depression Score: 4 Date of last PAP: No PAP/less than 21 years  Review of Systems: Review of Systems  Constitutional:  Negative for chills and fever.  HENT:  Negative for congestion, ear discharge, ear pain, hearing loss, sinus pain and sore throat.   Eyes:  Negative for blurred vision and double vision.  Respiratory:  Negative for cough, shortness of breath and wheezing.   Cardiovascular:  Negative for chest pain, palpitations and leg swelling.  Gastrointestinal:  Negative for abdominal pain, blood in stool, constipation, diarrhea, heartburn, melena, nausea and vomiting.  Genitourinary:  Negative for dysuria, flank pain, frequency, hematuria and urgency.  Musculoskeletal:  Negative for back pain, joint pain and myalgias.  Skin:  Negative for itching and rash.  Neurological:  Negative for dizziness, tingling, tremors, sensory change, speech change, focal weakness, seizures, loss of consciousness, weakness and headaches.  Endo/Heme/Allergies:  Negative for environmental allergies. Does not bruise/bleed easily.   Psychiatric/Behavioral:  Negative for depression, hallucinations, memory loss, substance abuse and suicidal ideas. The patient is not nervous/anxious and does not have insomnia.     Past Medical History:  Past Medical History:  Diagnosis Date   Medical history non-contributory    Patient denies medical problems     Past Surgical History:  Past Surgical History:  Procedure Laterality Date   denies      NO PAST SURGERIES      Family History:  Family History  Problem Relation Age of Onset   COPD Maternal Grandmother    Breast cancer Maternal Grandmother    COPD Maternal Grandfather    Leukemia Paternal Grandfather    Lung cancer Paternal Great-grandfather     Social History:  Social History   Socioeconomic History   Marital status: Single    Spouse name: Not on file   Number of children: Not on file   Years of education: Not on file   Highest education level: Not on file  Occupational History   Not on file  Tobacco Use   Smoking status: Never    Passive exposure: Never   Smokeless tobacco: Never  Vaping Use   Vaping Use: Former   Substances: Nicotine  Substance and Sexual Activity   Alcohol use: Not Currently    Comment: last use 12/2020- bottle of vodka   Drug use: Not Currently    Types: Marijuana   Sexual activity: Not Currently    Comment: hx of ocp, stopped 2021, prob with depression  Other Topics Concern   Not on file  Social History Narrative   Not on file   Social Determinants of Health   Financial Resource Strain: Not on file  Food Insecurity: No Food Insecurity (  02/07/2022)   Hunger Vital Sign    Worried About Running Out of Food in the Last Year: Never true    Ran Out of Food in the Last Year: Never true  Transportation Needs: No Transportation Needs (02/07/2022)   PRAPARE - Administrator, Civil Service (Medical): No    Lack of Transportation (Non-Medical): No  Physical Activity: Not on file  Stress: Not on file  Social  Connections: Not on file  Intimate Partner Violence: Not At Risk (02/07/2022)   Humiliation, Afraid, Rape, and Kick questionnaire    Fear of Current or Ex-Partner: No    Emotionally Abused: No    Physically Abused: No    Sexually Abused: No    Allergies:  No Known Allergies  Medications: Prior to Admission medications   Medication Sig Start Date End Date Taking? Authorizing Provider  norethindrone-ethinyl estradiol-FE (JUNEL FE 1/20) 1-20 MG-MCG tablet Take 1 tablet by mouth daily. 03/22/22  Yes Tresea Mall, CNM    Physical Exam Blood pressure 110/70, pulse (!) 101, height 4\' 11"  (1.499 m), weight 137 lb (62.1 kg), not currently breastfeeding.    General: NAD HEENT: normocephalic, anicteric Pulmonary: No increased work of breathing Abdomen: NABS, soft, non-tender, non-distended.  Umbilicus without lesions.  No hepatomegaly, splenomegaly or masses palpable. No evidence of hernia. Genitourinary: deferred for no concerns/no PAP Extremities: no edema, erythema, or tenderness Neurologic: Grossly intact Psychiatric: mood appropriate, affect full   Edinburgh Postnatal Depression Scale - 03/22/22 1013       Edinburgh Postnatal Depression Scale:  In the Past 7 Days   I have been able to laugh and see the funny side of things. 0    I have looked forward with enjoyment to things. 0    I have blamed myself unnecessarily when things went wrong. 0    I have been anxious or worried for no good reason. 1    I have felt scared or panicky for no good reason. 0    Things have been getting on top of me. 1    I have been so unhappy that I have had difficulty sleeping. 1    I have felt sad or miserable. 0    I have been so unhappy that I have been crying. 1    The thought of harming myself has occurred to me. 0    Edinburgh Postnatal Depression Scale Total 4             Assessment: 21 y.o. 26 presenting for 6 week postpartum visit  Plan: Problem List Items Addressed This  Visit   None Visit Diagnoses     6 weeks postpartum follow-up    -  Primary   Relevant Medications   norethindrone-ethinyl estradiol-FE (JUNEL FE 1/20) 1-20 MG-MCG tablet   Encounter for initial prescription of contraceptive pills       Relevant Medications   norethindrone-ethinyl estradiol-FE (JUNEL FE 1/20) 1-20 MG-MCG tablet        1) Contraception - Education given regarding options for contraception, as well as compatibility with breast feeding if applicable.  Patient plans on OCP (estrogen/progesterone) for contraception.  2)  Pap ASCCP guidelines and rational discussed.  Patient opts for  begin at age 34  screening interval  3) Patient underwent screening for postpartum depression with no signs of depression  4) Return in about 1 year (around 03/23/2023) for annual established gyn.   03/25/2023, CNM [facility] 03/22/2022, 10:18 AM

## 2022-05-05 ENCOUNTER — Encounter: Payer: Self-pay | Admitting: Advanced Practice Midwife

## 2022-12-25 IMAGING — US US OB < 14 WEEKS - US OB TV
1 series · 14 of 28 positions shown · non-contrast
Comparison: None.

CLINICAL DATA: Pelvic pain

EXAM:
OBSTETRIC <14 WK US AND TRANSVAGINAL OB US
TECHNIQUE: Both transabdominal and transvaginal ultrasound examinations were
performed for complete evaluation of the gestation as well as the
maternal uterus, adnexal regions, and pelvic cul-de-sac.
Transvaginal technique was performed to assess early pregnancy.

[Series 1: us ob less than 14 weeks with ob transvaginal · 14 of 130 slices shown]
[im 5/130]
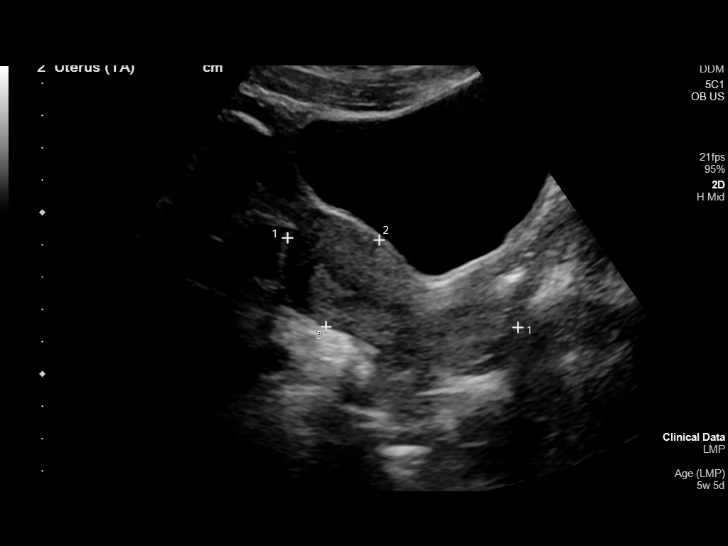
[im 15/130]
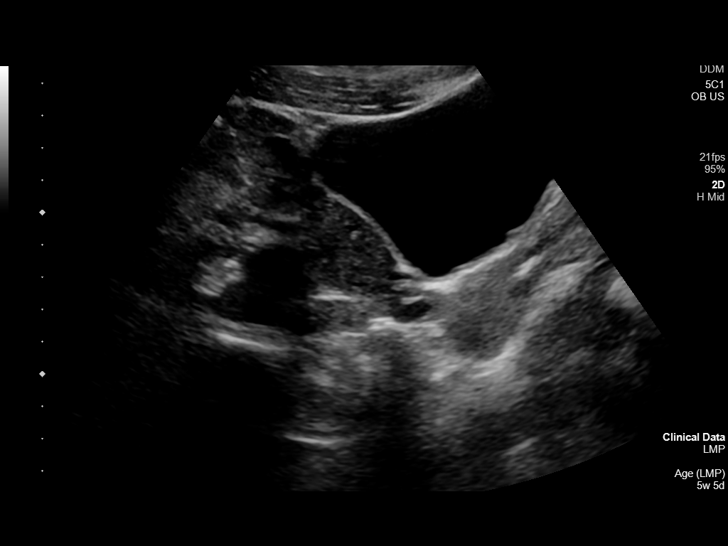
[im 24/130]
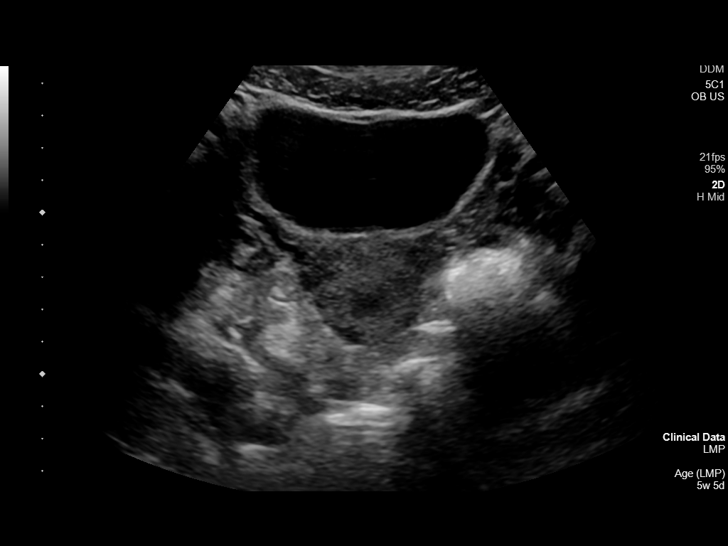
[im 34/130]
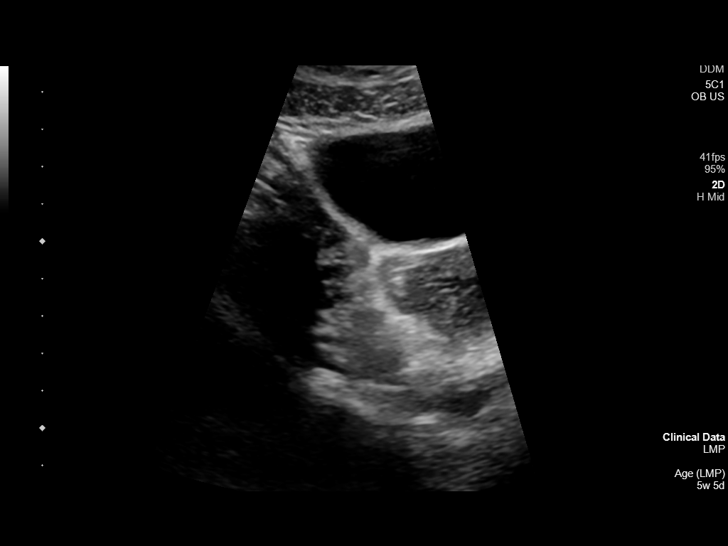
[im 44/130]
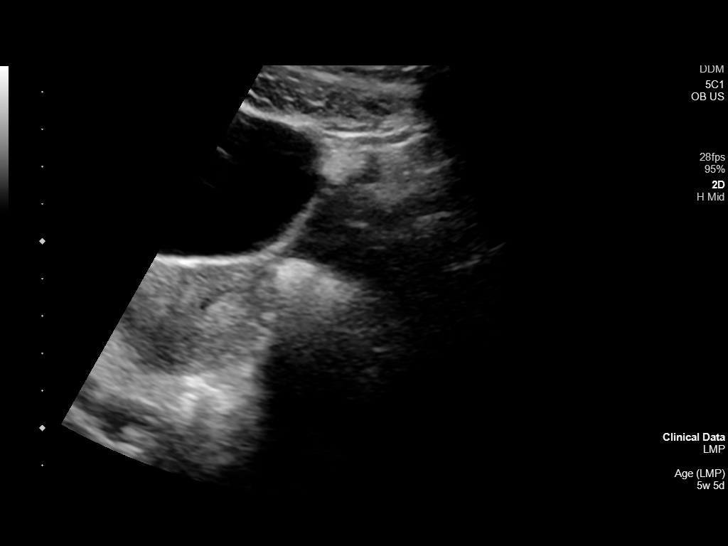
[im 53/130]
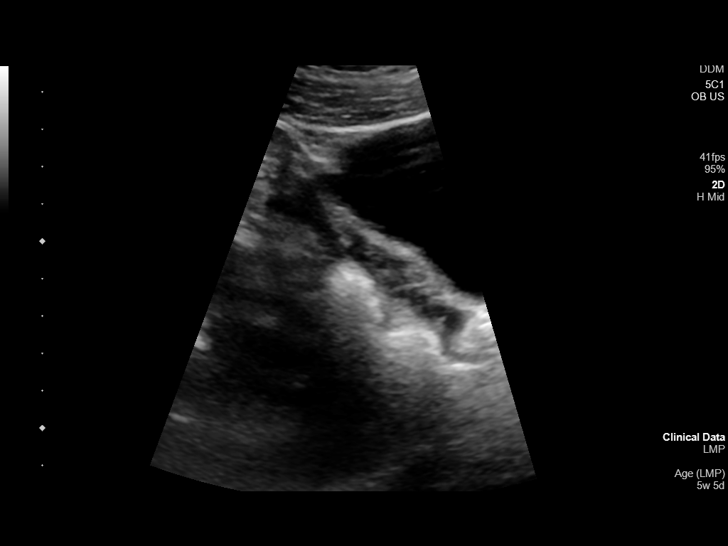
[im 63/130]
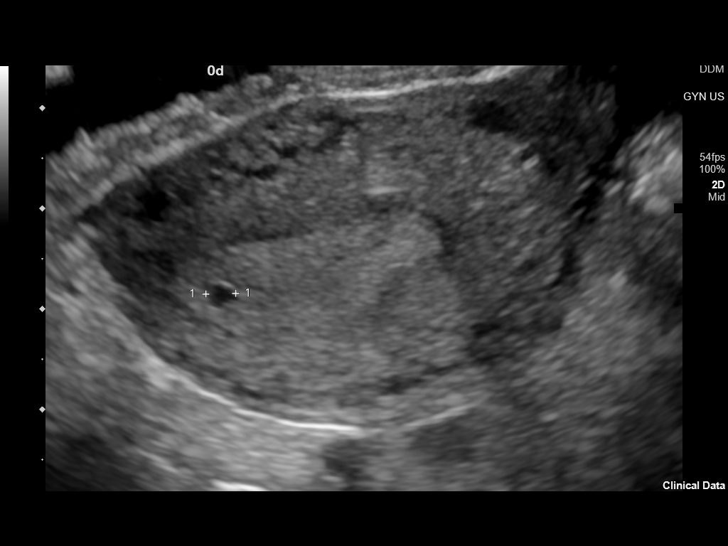
[im 72/130]
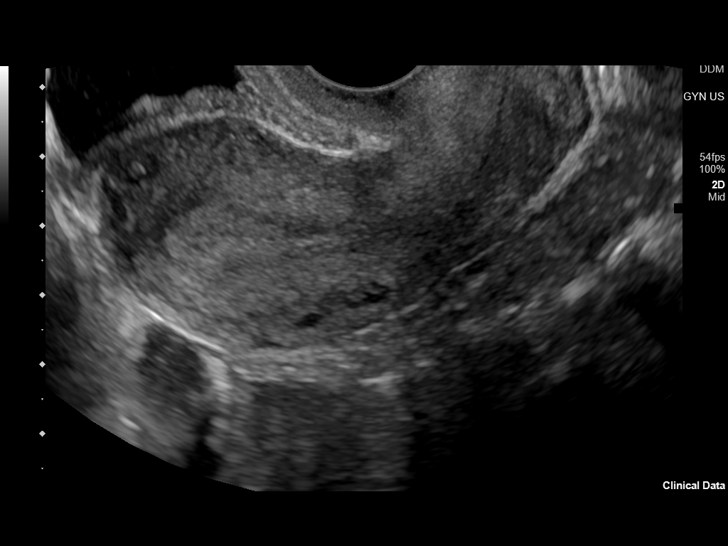
[im 82/130]
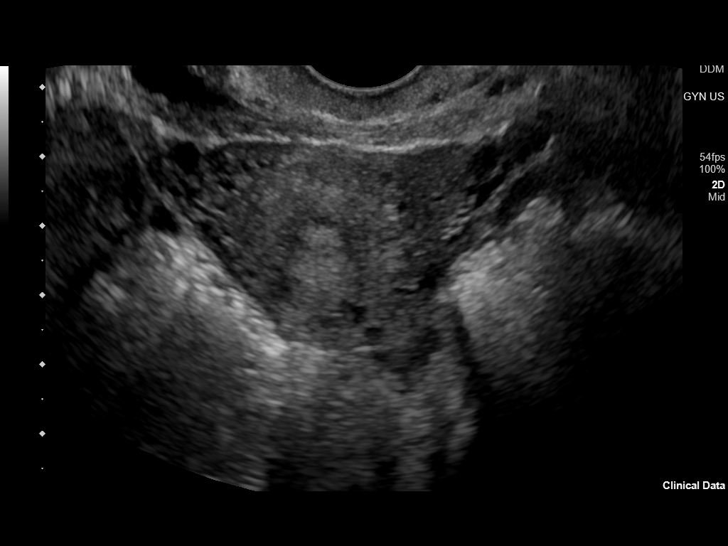
[im 91/130]
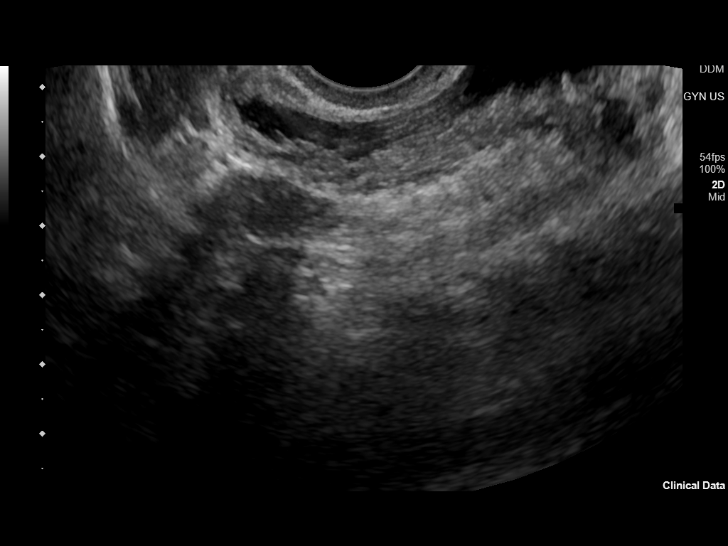
[im 101/130]
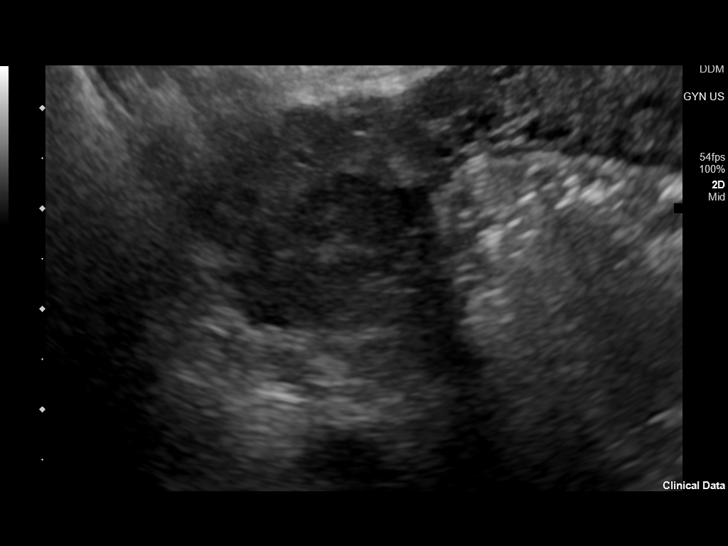
[im 110/130]
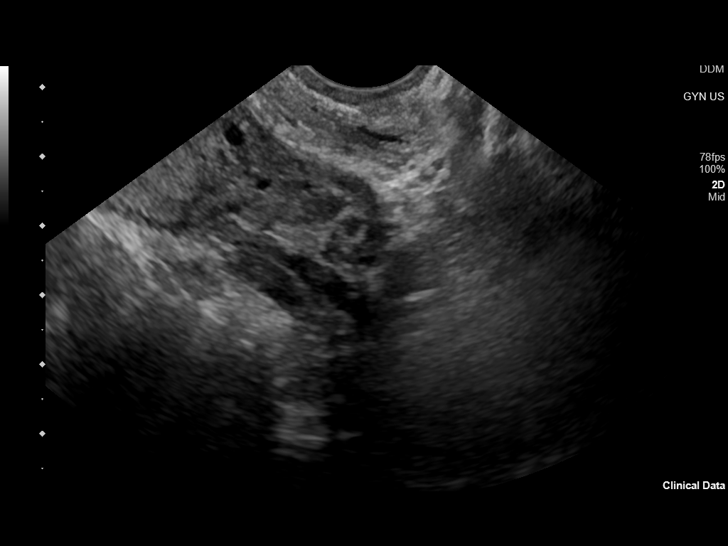
[im 120/130]
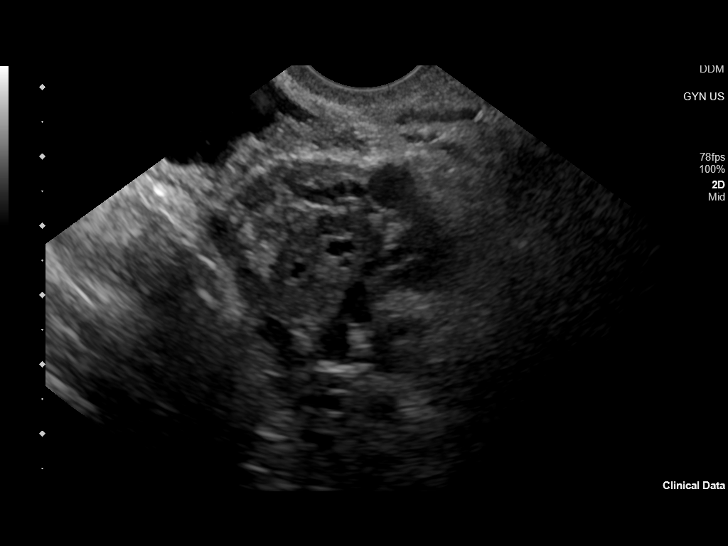
[im 130/130]
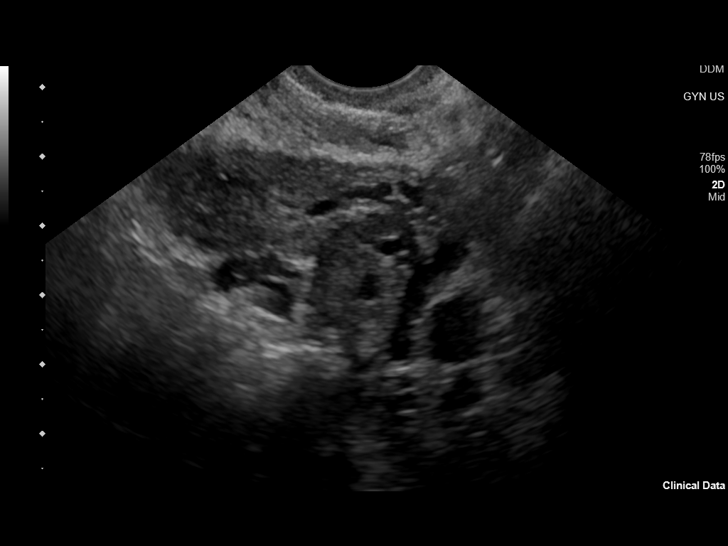

[14 of 28 positions shown; findings below may reference images not displayed]

FINDINGS: Intrauterine gestational sac: Possible tiny intrauterine gestational
sac.

Yolk sac:  Not seen

Embryo:  Not seen

MSD: 3.1 mm   5 w   0 d

Subchorionic hemorrhage:  None visualized.

Maternal uterus/adnexae: Ovaries are within normal limits. Right
ovary measures 2.7 by 1.9 by 2.4 cm and the left ovary measures
x 2 by 1.5 cm. No significant pelvic effusion.
IMPRESSION: Probable early intrauterine gestational sac, but no yolk sac, fetal
pole, or cardiac activity yet visualized. Recommend follow-up
quantitative B-HCG levels and follow-up US in 14 days to confirm and
assess viability. This recommendation follows SRU consensus
guidelines: Diagnostic Criteria for Nonviable Pregnancy Early in the
First Trimester. N Engl J Med 2647; [DATE].

## 2023-02-25 ENCOUNTER — Other Ambulatory Visit: Payer: Self-pay | Admitting: Advanced Practice Midwife

## 2023-02-25 DIAGNOSIS — Z30011 Encounter for initial prescription of contraceptive pills: Secondary | ICD-10-CM
